# Patient Record
Sex: Female | Born: 1985 | Race: White | Hispanic: No | Smoking: Current every day smoker
Health system: Southern US, Community
[De-identification: ages and names within clinical notes are randomized; demographics above are authoritative.]

## PROBLEM LIST (undated history)

## (undated) DIAGNOSIS — R011 Cardiac murmur, unspecified: Secondary | ICD-10-CM

## (undated) HISTORY — DX: Cardiac murmur, unspecified: R01.1

## (undated) HISTORY — PX: TYMPANOSTOMY TUBE PLACEMENT: SHX32

## (undated) HISTORY — PX: EAR TUBE REMOVAL: SHX1486

---

## 2015-07-14 ENCOUNTER — Ambulatory Visit: Payer: Self-pay

## 2015-07-14 ENCOUNTER — Ambulatory Visit (INDEPENDENT_AMBULATORY_CARE_PROVIDER_SITE_OTHER): Payer: Worker's Compensation | Admitting: Internal Medicine

## 2015-07-14 VITALS — BP 122/72 | HR 85 | Temp 98.6°F | Resp 17 | Ht 67.0 in | Wt 252.0 lb

## 2015-07-14 DIAGNOSIS — M25571 Pain in right ankle and joints of right foot: Secondary | ICD-10-CM

## 2015-07-14 DIAGNOSIS — M659 Synovitis and tenosynovitis, unspecified: Secondary | ICD-10-CM | POA: Diagnosis not present

## 2015-07-14 MED ORDER — MELOXICAM 15 MG PO TABS
15.0000 mg | ORAL_TABLET | Freq: Every day | ORAL | Status: DC
Start: 2015-07-14 — End: 2015-07-28

## 2015-07-14 NOTE — Progress Notes (Signed)
   Subjective:  This chart was scribed for Erin Siaobert Kylee Nardozzi, MD by Erin Pope, ED Scribe. This was seen in room 12 and the patient's care was started at 1:14 PM.   Patient ID: Erin Pope, female    DOB: [birth year], 29 y.o.   MRN: [id number]  HPI   Chief Complaint  Patient presents with  . Foot Pain    right foot pain    HPI Comments:patient  Erin Pope is a 29 y.o. female who presents to the [facility] complaining of an injury that occurred at work. In June she had an accident at work; slipping on a wet spot and hitting her right foot. She's had pain and swelling to right foot. The nurse hotline reccommended rice but has continued  this for over 3 months and problem has not resolved. She's had pain and swelling throughout the day. She is on her feet.   History reviewed. No pertinent past medical history. Prior to Admission medications   Not on File   Review of Systems  Musculoskeletal: Positive for myalgias, arthralgias and gait problem.   Objective:   Physical Exam  Constitutional: She is oriented to person, place, and time. She appears well-developed and well-nourished. No distress.  HENT:  Head: Normocephalic and atraumatic.  Eyes: Conjunctivae and EOM are normal.  Neck: Neck supple.  Cardiovascular: Normal rate.   Pulmonary/Chest: Effort normal.  Musculoskeletal: Normal range of motion.  Mildly swollen over MT 4 and 5 with pain with palpation and weight bearing without ecchymosis   Neurological: She is alert and oriented to person, place, and time.  Skin: Skin is warm and dry.  Psychiatric: She has a normal mood and affect. Her behavior is normal.  Nursing note and vitals reviewed.  Filed Vitals:   07/14/15 1153  BP: 122/72  Pulse: 85  Temp: 98.6 F (37 C)  TempSrc: Oral  Resp: 17  Height: 5\' 7"  (1.702 m)  Weight: 252 lb (114.306 kg)  SpO2: 99%   [facility] reading (PRIMARY) by Dr. Merla Pope.Right foot: no evidence of bony injury in MT  Assessment  & Plan:    1. Pain in joint, ankle and foot, right   2. Tenosynovitis of right foot    secondary to contusion and strain 3 months ago  Restriction of motion with Cam Walker and recheck in 10 days Meloxicam to reduce inflammation Continue ice daily F/u 11/11   Meds ordered this encounter  Medications  . meloxicam (MOBIC) 15 MG tablet    Sig: Take 1 tablet (15 mg total) by mouth daily.    Dispense:  30 tablet    Refill:  0    I have completed the patient encounter in its entirety as documented by the scribe, with editing by me where necessary. Hayly Litsey P. Erin Pope, M.D.

## 2015-07-25 ENCOUNTER — Other Ambulatory Visit: Payer: Self-pay | Admitting: Internal Medicine

## 2015-07-28 ENCOUNTER — Ambulatory Visit (INDEPENDENT_AMBULATORY_CARE_PROVIDER_SITE_OTHER): Payer: Worker's Compensation | Admitting: Internal Medicine

## 2015-07-28 VITALS — BP 122/72 | HR 94 | Temp 99.2°F | Resp 17 | Ht 67.0 in | Wt 261.0 lb

## 2015-07-28 DIAGNOSIS — M25571 Pain in right ankle and joints of right foot: Secondary | ICD-10-CM | POA: Diagnosis not present

## 2015-07-28 MED ORDER — METHOCARBAMOL 750 MG PO TABS
750.0000 mg | ORAL_TABLET | Freq: Three times a day (TID) | ORAL | Status: DC | PRN
Start: 2015-07-28 — End: 2016-07-30

## 2015-07-28 MED ORDER — INDOMETHACIN 50 MG PO CAPS: 50.0000 mg | ORAL_CAPSULE | Freq: Three times a day (TID) | ORAL | Status: DC | PRN

## 2015-07-28 NOTE — Progress Notes (Signed)
   Subjective:  This chart was scribed for Erin Siaobert Marietta Sikkema, MD by Erin Pope, ED Scribe. This patient was seen in room 14 and the patient's care was started at 3:52 PM.   Patient ID: Erin Pope, female    DOB: 03-13-1986, 29 y.o.   MRN: [id number]  HPI Chief Complaint  Patient presents with  . Follow-up    foot injury right side    HPI Comments: Erin Pope is a 29 y.o. female who presents to the [facility] for her 2nd workers compensation OV, a follow up from 10/31. Pt was seen here 2 weeks ago after she had a fall at work 3 months prior, injuring her right foot. She was put in a cam walker and prescribed meloxicam. Since last visit, she reports being out of work completely for the first week. She went back to work this week on light duty and sitting, working on the computer. She still has right foot pain with walking and bearing weight. She has had improved swelling but still has some swelling at night. Pt has been taking meloxicam without much help  Review of Systems Erin Pope    Objective:   Physical Exam  Constitutional: She is oriented to person, place, and time. She appears well-developed and well-nourished. No distress.  HENT:  Head: Normocephalic and atraumatic.  Eyes: Conjunctivae and EOM are normal.  Neck: Neck supple.  Cardiovascular: Normal rate.   Pulmonary/Chest: Effort normal.  Musculoskeletal: Normal range of motion.  Foot is less swollen but is still tender over 4th and 5th MT especially proximally on dorsal aspect. She is now tender over the syndesmosis. Dorsiflexion is good without pain but weight bearing is painful thru the forefoot Squeeze nontender and MTPs stable   Neurological: She is alert and oriented to person, place, and time.  Skin: Skin is warm and dry.  Psychiatric: She has a normal mood and affect. Her behavior is normal.  Nursing note and vitals reviewed.  Filed Vitals:   07/28/15 1457  BP: 122/72  Pulse: 94  Temp: 99.2 F (37.3 C)   TempSrc: Oral  Resp: 17  Height: 5\' 7"  (1.702 m)  Weight: 261 lb (118.389 kg)  SpO2: 97%   Assessment & Plan:   1. Pain in joint, ankle and foot, right   ?etiology--if just tenosynovitis I think she should have had a better response to the Cam, so we will refer to Dr Victorino DikeHewitt for eval  Continue cam walker Light duty work    Orders Placed This Encounter  Procedures  . Ambulatory referral to Orthopedic Surgery    Referral Priority:  Routine    Referral Type:  Surgical    Referral Reason:  Specialty Services Required    Referred to Provider:  Toni ArthursJohn Hewitt, MD    Requested Specialty:  Orthopedic Surgery    Number of Visits Requested:  1      I have completed the patient encounter in its entirety as documented by the scribe, with editing by me where necessary. Erin Pope P. Merla Richesoolittle, M.D.

## 2015-08-01 ENCOUNTER — Other Ambulatory Visit: Payer: Self-pay | Admitting: Internal Medicine

## 2015-09-19 ENCOUNTER — Ambulatory Visit (INDEPENDENT_AMBULATORY_CARE_PROVIDER_SITE_OTHER): Payer: Worker's Compensation | Admitting: Internal Medicine

## 2015-09-19 ENCOUNTER — Ambulatory Visit: Payer: Worker's Compensation

## 2015-09-19 VITALS — BP 110/72 | HR 90 | Temp 98.3°F | Resp 18 | Ht 67.0 in | Wt 265.0 lb

## 2015-09-19 DIAGNOSIS — M25571 Pain in right ankle and joints of right foot: Secondary | ICD-10-CM

## 2015-09-19 NOTE — Progress Notes (Signed)
Subjective:  By signing my name below, I, Rawaa Al Rifaie, attest that this documentation has been prepared under the direction and in the presence of Ellamae Sia, MD.  Watt Climes Rifaie, Medical Scribe. 09/19/2015.  9:04 AM. I have completed the patient encounter in its entirety as documented by the scribe, with editing by me where necessary. Tyller Bowlby P. Merla Riches, M.D.    Patient ID: Erin Pope, female    DOB: Dec 09, 1985, 30 y.o.   MRN: [id number]  Chief Complaint  Patient presents with  . Follow-up    rt foot     HPI HPI Comments: Erin Pope is a 30 y.o. female who presents to [facility] for a W/C follow up regarding a right foot injury. a follow up from 11/14. Pt was seen here 2 weeks prior after she had a fall at work 3 months prior pm 10/31, injuring her right foot. She was put in a cam walker and prescribed meloxicam. Today, pt indicates that she has to be standing on her feet for a long time at work, and reports that it is not very convenient for her job. She states that her boss had stated that on 01/28 she will not be able to work if she is still on her boots due to a change in the computer system. Pt reports that her foot pain has not improved much. She notes that the pain is present in the mid aspect on the top of the foot, and indicates that the pain is most severe at the morning time after waking up. Pt states that she attempted to follow up with orthopedics yesterday, however there were some logistic problems that showed that she was a self-referral, therefore she rescheduled.   Prior to Admission medications   Medication Sig Start Date End Date Taking? Authorizing Provider  indomethacin (INDOCIN) 50 MG capsule Take 1 capsule (50 mg total) by mouth 3 (three) times daily as needed. Take with food Patient not taking: Reported on 09/19/2015 07/28/15   Tonye Pearson, MD  methocarbamol (ROBAXIN-750) 750 MG tablet Take 1 tablet (750 mg total) by mouth every  8 (eight) hours as needed for muscle spasms. Patient not taking: Reported on 09/19/2015 07/28/15   Tonye Pearson, MD    Allergies  Allergen Reactions  . Bactrim [Sulfamethoxazole-Trimethoprim] Hives    Review of Systems  Musculoskeletal: Positive for arthralgias. Negative for joint swelling.      Objective:   Physical Exam  Constitutional: She is oriented to person, place, and time. She appears well-developed and well-nourished. No distress.  HENT:  Head: Normocephalic and atraumatic.  Eyes: EOM are normal. Pupils are equal, round, and reactive to light.  Neck: Neck supple.  Cardiovascular: Normal rate.   Pulmonary/Chest: Effort normal.  Musculoskeletal: She exhibits tenderness.  Very tender over the mid fourth metatarsal without swelling or redness. Pain worsened by inversion.  Ankle stable.  5th metatarsal non tender.  Achilles intact.   Neurological: She is alert and oriented to person, place, and time. No cranial nerve deficit.  Skin: Skin is warm and dry.  Psychiatric: She has a normal mood and affect. Her behavior is normal.  Nursing note and vitals reviewed.   [facility] (PRIMARY) x-ray report read by Dr. Ellamae Sia, MD: Right foot- no bony abnormality.   BP 110/72 mmHg  Pulse 90  Temp(Src) 98.3 F (36.8 C) (Oral)  Resp 18  Ht 5\' 7"  (1.702 m)  Wt 265 lb (120.203 kg)  BMI 41.50  kg/m2  SpO2 98%  LMP 09/08/2015     Assessment & Plan:  Pain in joint, ankle and foot, right - Plan: DG Foot Complete Right   Disc with our WC desk who will rearrange referral--OOW til then unless they can assure nonweightbearing

## 2015-10-14 ENCOUNTER — Encounter: Payer: Self-pay | Admitting: Podiatry

## 2015-10-14 ENCOUNTER — Ambulatory Visit (INDEPENDENT_AMBULATORY_CARE_PROVIDER_SITE_OTHER): Payer: Worker's Compensation | Admitting: Podiatry

## 2015-10-14 VITALS — BP 110/78 | HR 93 | Resp 16

## 2015-10-14 DIAGNOSIS — G5761 Lesion of plantar nerve, right lower limb: Secondary | ICD-10-CM

## 2015-10-14 MED ORDER — METHYLPREDNISOLONE 4 MG PO TBPK: ORAL_TABLET | ORAL | Status: DC

## 2015-10-14 NOTE — Progress Notes (Signed)
   Subjective:    Patient ID: Erin Pope, female    DOB: 11-Jan-1986, 30 y.o.   MRN: [id number]  HPI : she presents to see me today for second opinion regarding her right foot. She's been seen by previous doctors replaced her in a Cam Walker for tendinitis or bone pain to the right foot. June 2016 I'll working at [employer] she slipped on mats that held water beneath it. She fell rotating the foo inversion. She states that she has radiating pains and dull aching pains and it really doesn't bother her if she is off of it.    Review of Systems  All other systems reviewed and are negative.      Objective:   Physical Exam : vital signs are stable she is alert and oriented 30 year old female palpable pulses neurologic sensorium is intact with a palpable Mulder's click third interdigital space right foot. Radiographs do not demonstrate any type of osseus abnormalities at this point if there were any osseous problems from the fall we would be able to see them from fracture callus. Deep tendon reflexes are intact and muscle strength is 5 over 5 dorsiflexors plantar flexors and inverters everters all intrinsic musculature is intact. Cutaneous evaluation of a straight supple well-hydrated cutis no erythema edema cellulitis drainage or odor. Orthopedic evaluation of his joints all joints distal to the ankle for range of motion without crepitation.        Assessment & Plan:   assessment: it appears to be a neuroma third interdigital space of the right foot.   plan: I injected the area today with Kenalog and local anesthetic placed her on a Medrol Dosepak to be followed by meloxicam and I will follow-up with her in 2 weeks and consider an MRI if she is not improved.

## 2015-10-27 DIAGNOSIS — Z0271 Encounter for disability determination: Secondary | ICD-10-CM

## 2015-10-28 ENCOUNTER — Encounter: Payer: Self-pay | Admitting: Podiatry

## 2015-10-28 ENCOUNTER — Ambulatory Visit (INDEPENDENT_AMBULATORY_CARE_PROVIDER_SITE_OTHER): Payer: Worker's Compensation | Admitting: Podiatry

## 2015-10-28 VITALS — BP 110/72 | HR 84 | Resp 16

## 2015-10-28 DIAGNOSIS — G5761 Lesion of plantar nerve, right lower limb: Secondary | ICD-10-CM

## 2015-10-28 NOTE — Progress Notes (Signed)
She presents today for follow-up of a neuroma third interdigital space of her right foot.she stated is still tender after the steroid injection.  Objective: Vital signs are stable she is alert and oriented 3. Pulses are palpable. Palpable Mulder's click and third interdigital space of the right foot is consistent with neuroma.  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  Plan: Reinjected today started with dehydrated alcohol and will follow up with her in 3 weeks for her second dose.

## 2015-11-18 ENCOUNTER — Encounter: Payer: Self-pay | Admitting: Podiatry

## 2015-11-18 ENCOUNTER — Ambulatory Visit (INDEPENDENT_AMBULATORY_CARE_PROVIDER_SITE_OTHER): Payer: Worker's Compensation | Admitting: Podiatry

## 2015-11-18 VITALS — BP 102/56 | HR 86 | Resp 16

## 2015-11-18 DIAGNOSIS — G5761 Lesion of plantar nerve, right lower limb: Secondary | ICD-10-CM | POA: Diagnosis not present

## 2015-11-19 NOTE — Progress Notes (Signed)
She presents today after her first dose of dehydrated alcohol and states it is not any better yet. She states that since I put her back to work on regular shift her foot is throbbing by the end of the day and would like to have some type of medicine to help control that. I explained to her that we would not provide her with a narcotic for this that a nonsteroidal would probably suffice and she states that she has that at home.  Objective: Vital signs stable alert and oriented 3. Palpable Mulder's click to the third interdigital space of the right foot.  Assessment: Neuroma third interdigital space right foot.  Plan: I injected her second dose of dehydrated alcohol today third interdigital space of the right foot. Follow up with her in 3 weeks for her third dose.

## 2015-12-09 ENCOUNTER — Encounter: Payer: Self-pay | Admitting: Podiatry

## 2015-12-09 ENCOUNTER — Ambulatory Visit (INDEPENDENT_AMBULATORY_CARE_PROVIDER_SITE_OTHER): Payer: Worker's Compensation | Admitting: Podiatry

## 2015-12-09 DIAGNOSIS — G5761 Lesion of plantar nerve, right lower limb: Secondary | ICD-10-CM | POA: Diagnosis not present

## 2015-12-09 NOTE — Progress Notes (Signed)
She presents today for follow-up of her first dehydrated alcohol injection third interdigital space right foot. She states this doing so-so.  Objective: Vital signs are stable she is alert and oriented 3. She still has pain on palpation third interdigital space of the right foot with a palpable Mulder's click. Pulses remain palpable.  Assessment: Neuroma third interdigital space right foot slowly improving.  Plan: We injected her second dose of dehydrated alcohol today and I will follow-up with her in 3 weeks.

## 2016-01-01 ENCOUNTER — Ambulatory Visit (INDEPENDENT_AMBULATORY_CARE_PROVIDER_SITE_OTHER): Payer: Worker's Compensation | Admitting: Podiatry

## 2016-01-01 ENCOUNTER — Encounter: Payer: Self-pay | Admitting: Podiatry

## 2016-01-01 DIAGNOSIS — G5761 Lesion of plantar nerve, right lower limb: Secondary | ICD-10-CM

## 2016-01-03 NOTE — Progress Notes (Signed)
She presents today for another injection third interdigital space of the right foot. She states that after the last injection he was feeling pretty good now may have overdone it by exercising. So starting to hurt again.  Objective: Vital signs are stable she is alert and oriented 3. Pulses are palpable. Palpable Mulder's click third interdigital space of the right foot. No calf pain no open lesions or wounds.  Assessment: Neuroma third interdigital space right foot. Slowly resolving with dehydrated alcohol injections.  Plan: Discussed appropriate shoe gear stretching exercises and ice therapy. Reinjected another dose of dehydrated alcohol third interdigital space of the right foot. Follow up with her in 3 weeks.

## 2016-01-22 ENCOUNTER — Ambulatory Visit: Payer: Self-pay | Admitting: Podiatry

## 2016-02-05 ENCOUNTER — Ambulatory Visit: Payer: Self-pay | Admitting: Podiatry

## 2016-07-30 ENCOUNTER — Ambulatory Visit (INDEPENDENT_AMBULATORY_CARE_PROVIDER_SITE_OTHER): Payer: Self-pay | Admitting: Physician Assistant

## 2016-07-30 VITALS — BP 116/72 | HR 82 | Temp 98.2°F | Resp 16 | Ht 67.0 in | Wt 283.0 lb

## 2016-07-30 DIAGNOSIS — K047 Periapical abscess without sinus: Secondary | ICD-10-CM

## 2016-07-30 DIAGNOSIS — K0889 Other specified disorders of teeth and supporting structures: Secondary | ICD-10-CM

## 2016-07-30 DIAGNOSIS — R05 Cough: Secondary | ICD-10-CM

## 2016-07-30 DIAGNOSIS — R0981 Nasal congestion: Secondary | ICD-10-CM

## 2016-07-30 DIAGNOSIS — R062 Wheezing: Secondary | ICD-10-CM

## 2016-07-30 DIAGNOSIS — R059 Cough, unspecified: Secondary | ICD-10-CM

## 2016-07-30 MED ORDER — ALBUTEROL SULFATE HFA 108 (90 BASE) MCG/ACT IN AERS: 2.0000 | INHALATION_SPRAY | RESPIRATORY_TRACT | 0 refills | Status: DC | PRN

## 2016-07-30 MED ORDER — AMOXICILLIN-POT CLAVULANATE 875-125 MG PO TABS: 1.0000 | ORAL_TABLET | Freq: Two times a day (BID) | ORAL | 0 refills | Status: DC

## 2016-07-30 MED ORDER — BENZONATATE 100 MG PO CAPS: 100.0000 mg | ORAL_CAPSULE | Freq: Three times a day (TID) | ORAL | 0 refills | Status: DC | PRN

## 2016-07-30 MED ORDER — NAPROXEN 500 MG PO TABS: 500.0000 mg | ORAL_TABLET | Freq: Two times a day (BID) | ORAL | 0 refills | Status: DC

## 2016-07-30 MED ORDER — FLUTICASONE PROPIONATE 50 MCG/ACT NA SUSP: 2.0000 | Freq: Every day | NASAL | 0 refills | Status: DC

## 2016-07-30 MED ORDER — HYDROCOD POLST-CPM POLST ER 10-8 MG/5ML PO SUER: 5.0000 mL | Freq: Two times a day (BID) | ORAL | 0 refills | Status: DC | PRN

## 2016-07-30 MED ORDER — KETOROLAC TROMETHAMINE 60 MG/2ML IM SOLN
60.0000 mg | Freq: Once | INTRAMUSCULAR | Status: AC
  Administered 2016-07-30: 60 mg via INTRAMUSCULAR

## 2016-07-30 NOTE — Progress Notes (Signed)
Erin Pope  MRN: [id number] DOB: 1985/10/28  Subjective:  Erin Pope is a 30 y.o. female seen in office today for a chief complaint of upper right tooth pain x 4 days. Pt notes she had a head cold 3 weeks ago which has never quite went away. She [place] and once she got back her sickness was back in full swing with nasal congestion and nonproductive cough. She also had new onset tooth pain. She rates the tooth pain as a 9/10. Has associated swelling. Denies purulent drainage and sensitivity. She has tried aleve, oragel, and salt water gargles with minimal relief. Notes she has a broken tooth in her mouth and the pain is above it. She has not been able to go to the dentist because she does not have insurance and states that it would be too expensive.   Review of Systems  Constitutional: Positive for fever (intermittent). Negative for chills, diaphoresis and fatigue.  HENT: Positive for congestion and rhinorrhea. Negative for sneezing and sore throat.   Respiratory: Negative for chest tightness and shortness of breath.   Gastrointestinal: Negative for diarrhea, nausea and vomiting.  Musculoskeletal: Negative for myalgias.  Neurological: Positive for headaches. Negative for dizziness.    There are no active problems to display for this patient.   No current outpatient prescriptions on file prior to visit.   No current facility-administered medications on file prior to visit.     Allergies  Allergen Reactions  . Bactrim [Sulfamethoxazole-Trimethoprim] Hives     Objective:  BP 116/72   Pulse 82   Temp 98.2 F (36.8 C) (Oral)   Resp 16   Ht 5\' 7"  (1.702 m)   Wt 283 lb (128.4 kg)   SpO2 96%   BMI 44.32 kg/m   Physical Exam  Constitutional: She is oriented to person, place, and time.  Well developed, well nourished, patient is in moderate pain sitting on exam table, intermittently coughing during exam.  HENT:  Head: Normocephalic and  atraumatic.  Right Ear: A middle ear effusion is present.  Left Ear: A middle ear effusion is present.  Nose: Mucosal edema present. Right sinus exhibits maxillary sinus tenderness. Right sinus exhibits no frontal sinus tenderness. Left sinus exhibits no maxillary sinus tenderness and no frontal sinus tenderness.  Mouth/Throat: Uvula is midline and mucous membranes are normal. Posterior oropharyngeal erythema present.    Right upper gum with ~ 1.5 cm inflammation and erythema just superior to tooth socket with partial tooth remnants with signs of tooth decay.  Exquisite pain to palpation of upper right gum. No purulent discharge expressed. Dental caries and tooth decay noted thorughout mouth.   Cardiovascular: Normal rate and regular rhythm.   Pulmonary/Chest: Effort normal. She has wheezes (intermittent in posterior RUF, cleared with cough).  Lymphadenopathy:       Head (right side): No submental, no submandibular, no tonsillar, no preauricular, no posterior auricular and no occipital adenopathy present.       Head (left side): No submental, no submandibular, no tonsillar, no preauricular, no posterior auricular and no occipital adenopathy present.    She has no cervical adenopathy.       Right: No supraclavicular adenopathy present.       Left: No supraclavicular adenopathy present.  Neurological: She is alert and oriented to person, place, and time. Gait normal.  Skin: Skin is warm and dry.  Psychiatric: Affect normal.    Assessment and Plan :   1. Tooth  pain - ketorolac (TORADOL) injection 60 mg; Inject 2 mLs (60 mg total) into the muscle once.  2. Dental abscess -Pt instructed that she needs evaluation from a dentist immediately. She was given information on dentist offices that are open tomorrow. The importance of having this examined by a dentist was highly stressed to patient and she agrees to seek care from a dentist despite having no dental insurance.  - amoxicillin-clavulanate  (AUGMENTIN) 875-125 MG tablet; Take 1 tablet by mouth 2 (two) times daily.  Dispense: 20 tablet; Refill: 0 - naproxen (NAPROSYN) 500 MG tablet; Take 1 tablet (500 mg total) by mouth 2 (two) times daily with a meal.  Dispense: 30 tablet; Refill: 0  3. Wheezing - albuterol (PROVENTIL HFA;VENTOLIN HFA) 108 (90 Base) MCG/ACT inhaler; Inhale 2 puffs into the lungs every 4 (four) hours as needed for wheezing or shortness of breath (cough, shortness of breath or wheezing.).  Dispense: 1 Inhaler; Refill: 0  4. Cough - chlorpheniramine-HYDROcodone (TUSSIONEX PENNKINETIC ER) 10-8 MG/5ML SUER; Take 5 mLs by mouth every 12 (twelve) hours as needed for cough.  Dispense: 100 mL; Refill: 0 - benzonatate (TESSALON) 100 MG capsule; Take 1-2 capsules (100-200 mg total) by mouth 3 (three) times daily as needed for cough.  Dispense: 40 capsule; Refill: 0  5. Nasal congestion - fluticasone (FLONASE) 50 MCG/ACT nasal spray; Place 2 sprays into both nostrils daily.  Dispense: 16 g; Refill: 0  -Return to clinic if symptoms worsen, do not improve, or as needed   Benjiman CoreBrittany Camron Essman PA-C  [facility] 07/30/2016 6:55 PM

## 2016-07-30 NOTE — Patient Instructions (Addendum)
Go and immediately get the antibiotic. Start it today. You should contact a dentist as soon as possible so they can extract the tooth. [facility] is opened on Saturday.  For cough and cold symptoms use the cough syrup at night and the tessalon perles during the day.  You can use the albuterol inhaler every 4-6 hours as needed for wheezing.  You can use the flonase for congestion.   Please seek care immediately if your symptoms worsen or you develop fever, chills, or excess pain.    IF you received an x-ray today, you will receive an invoice from [facility]. Please contact [facility] at [phone] with questions or concerns regarding your invoice.   IF you received labwork today, you will receive an invoice from [facility]. Please contact [facility] at [phone] with questions or concerns regarding your invoice.   Our billing staff will not be able to assist you with questions regarding bills from these companies.  You will be contacted with the lab results as soon as they are available. The fastest way to get your results is to activate your My Chart account. Instructions are located on the last page of this paperwork. If you have not heard from us regarding the results in 2 weeks, please contact this office.

## 2017-03-08 IMAGING — CR DG FOOT COMPLETE 3+V*R*
3 series · 3 of 3 positions shown · non-contrast
Comparison: 07/14/2015.

CLINICAL DATA: Pain in the midportion of the fourth metatarsal.

EXAM:
RIGHT FOOT COMPLETE - 3+ VIEW

[AP]
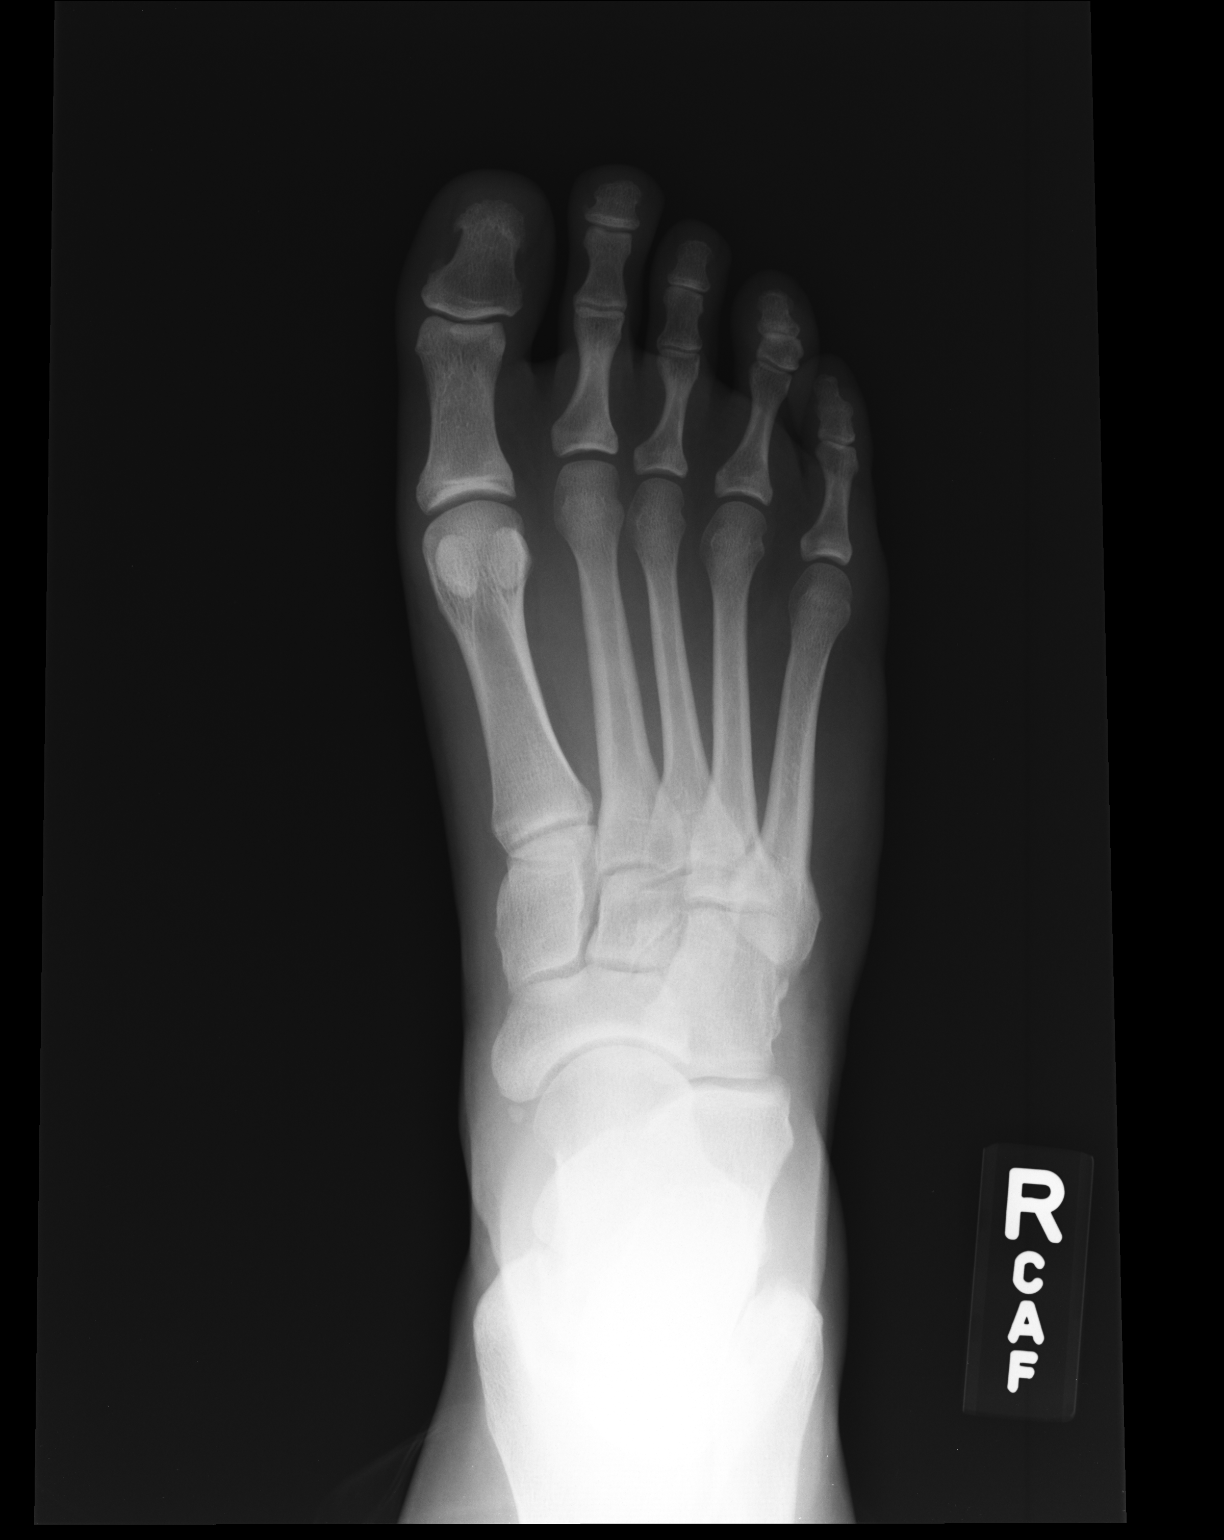

[ap obl int rot]
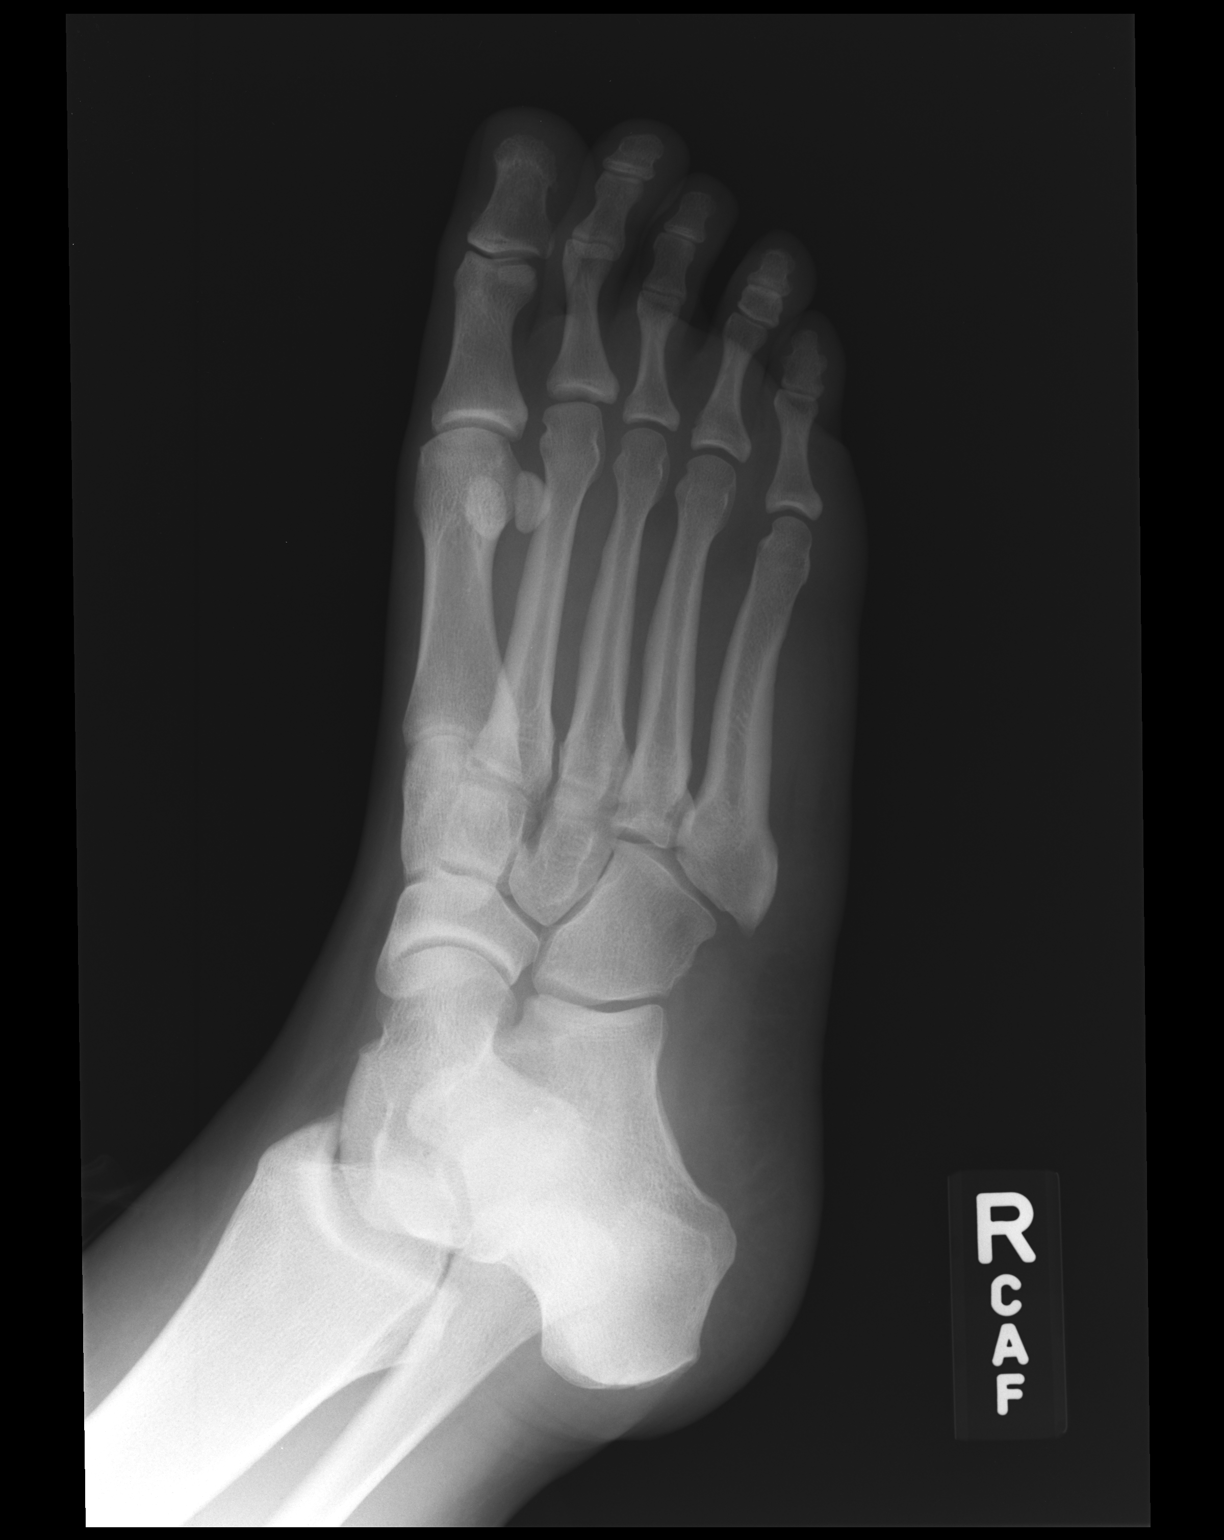

[lateral]
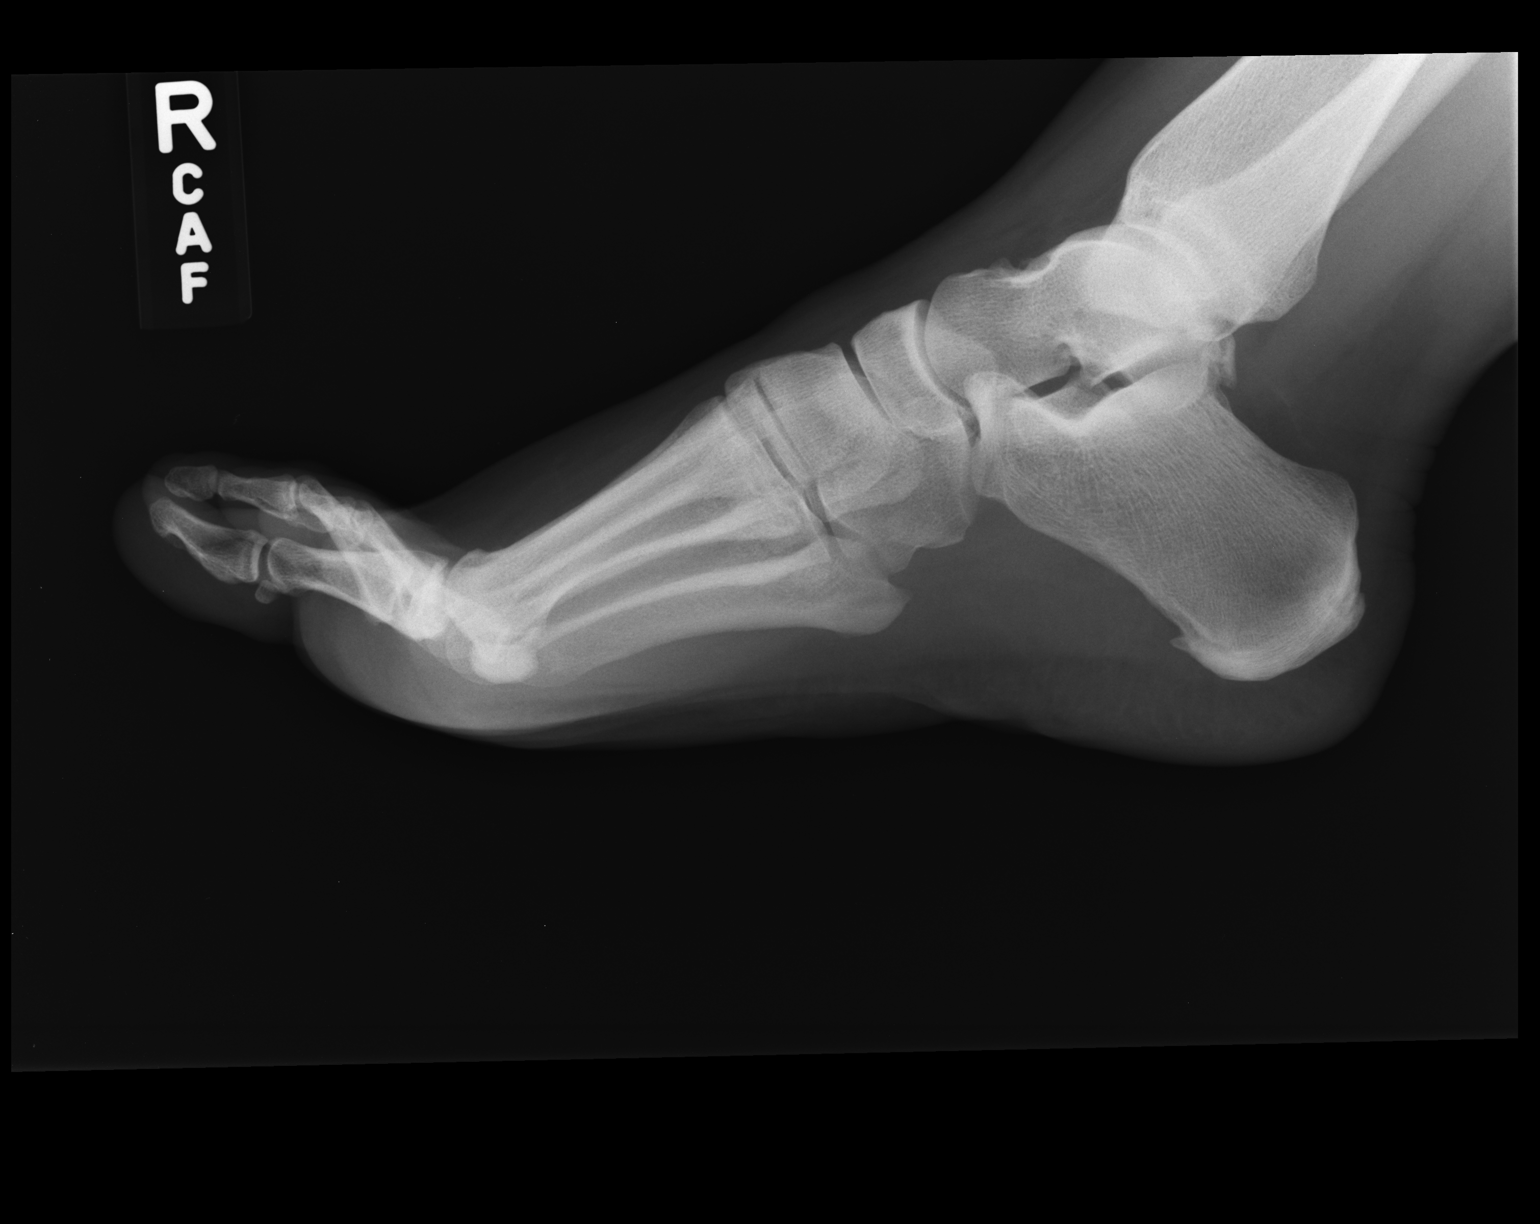

[3 of 3 positions shown; findings below may reference images not displayed]

FINDINGS: There is no evidence of fracture or dislocation. There is no
evidence of arthropathy or other focal bone abnormality. Soft
tissues are unremarkable.
IMPRESSION: Normal

## 2017-06-02 ENCOUNTER — Encounter: Payer: Self-pay | Admitting: Family Medicine

## 2017-06-02 ENCOUNTER — Ambulatory Visit (INDEPENDENT_AMBULATORY_CARE_PROVIDER_SITE_OTHER): Payer: Self-pay | Admitting: Family Medicine

## 2017-06-02 VITALS — BP 108/74 | HR 86 | Temp 97.7°F | Resp 16 | Ht 67.0 in | Wt 252.0 lb

## 2017-06-02 DIAGNOSIS — J069 Acute upper respiratory infection, unspecified: Secondary | ICD-10-CM

## 2017-06-02 MED ORDER — GUAIFENESIN-CODEINE 100-10 MG/5ML PO SYRP: 5.0000 mL | ORAL_SOLUTION | Freq: Three times a day (TID) | ORAL | 0 refills | Status: DC | PRN

## 2017-06-02 NOTE — Progress Notes (Signed)
9/20/201811:29 AM  Erin Pope [birth year], 31 y.o. female [id number]  Chief Complaint  Patient presents with  . Cough    x 1 week, no fevers`  . URI    x 1 week    HPI:   Patient is a 31 y.o. female who presents today for 1 week of cough, non productive, nasal congestion and pressure and diarrhea. Denies any sore throat, ear pain, sinus pain, sob, wheezing, nausea or vomiting. She does smoke, denies any h/o lung disease. + sick contacts at work. Has tried mucinex- DM without any improvement in symptoms or sleep.   Depression screen [facility] 2/9 06/02/2017 07/28/2015 07/14/2015  Decreased Interest 0 0 0  Down, Depressed, Hopeless 0 0 0  PHQ - 2 Score 0 0 0    Allergies  Allergen Reactions  . Bactrim [Sulfamethoxazole-Trimethoprim] Hives    Current Outpatient Prescriptions on File Prior to Visit  Medication Sig Dispense Refill  . albuterol (PROVENTIL HFA;VENTOLIN HFA) 108 (90 Base) MCG/ACT inhaler Inhale 2 puffs into the lungs every 4 (four) hours as needed for wheezing or shortness of breath (cough, shortness of breath or wheezing.). 1 Inhaler 0  . amoxicillin-clavulanate (AUGMENTIN) 875-125 MG tablet Take 1 tablet by mouth 2 (two) times daily. (Patient not taking: Reported on 06/02/2017) 20 tablet 0  . benzonatate (TESSALON) 100 MG capsule Take 1-2 capsules (100-200 mg total) by mouth 3 (three) times daily as needed for cough. (Patient not taking: Reported on 06/02/2017) 40 capsule 0  . chlorpheniramine-HYDROcodone (TUSSIONEX PENNKINETIC ER) 10-8 MG/5ML SUER Take 5 mLs by mouth every 12 (twelve) hours as needed for cough. (Patient not taking: Reported on 06/02/2017) 100 mL 0  . fluticasone (FLONASE) 50 MCG/ACT nasal spray Place 2 sprays into both nostrils daily. (Patient not taking: Reported on 06/02/2017) 16 g 0  . naproxen (NAPROSYN) 500 MG tablet Take 1 tablet (500 mg total) by mouth 2 (two) times daily with a meal. (Patient not taking: Reported on 06/02/2017) 30 tablet 0   No current  facility-administered medications on file prior to visit.     No past medical history on file.  Past Surgical History:  Procedure Laterality Date  . EAR TUBE REMOVAL      Social History  Substance Use Topics  . Smoking status: Current Every Day Smoker    Packs/day: 0.50    Years: 8.00    Types: Cigarettes  . Smokeless tobacco: Not on file  . Alcohol use No    No family history on file.  Review of Systems  Constitutional: Negative for chills and fever.  HENT: Positive for congestion. Negative for ear pain, sinus pain and sore throat.   Respiratory: Positive for cough. Negative for sputum production, shortness of breath and wheezing.   Cardiovascular: Negative for chest pain, palpitations and leg swelling.  Gastrointestinal: Positive for diarrhea. Negative for abdominal pain, nausea and vomiting.     OBJECTIVE:  Blood pressure 108/74, pulse 86, temperature 97.7 F (36.5 C), temperature source Oral, resp. rate 16, height  (1.702 m), weight 252 lb (114.3 kg), last menstrual period 05/30/2017, SpO2 96 %.  Physical Exam  Constitutional: She is oriented to person, place, and time and well-developed, well-nourished, and in no distress.  HENT:  Head: Normocephalic and atraumatic.  Right Ear: Hearing, tympanic membrane, external ear and ear canal normal.  Left Ear: Hearing, tympanic membrane, external ear and ear canal normal.  Mouth/Throat: Oropharynx is clear and moist.  Eyes: Pupils are equal, round, and reactive to  light. EOM are normal.  Neck: Neck supple.  Cardiovascular: Normal rate, regular rhythm and normal heart sounds.  Exam reveals no gallop and no friction rub.   No murmur heard. Pulmonary/Chest: Effort normal and breath sounds normal. She has no wheezes. She has no rales.  Abdominal: Soft. Bowel sounds are normal. She exhibits no distension. There is no tenderness.  Lymphadenopathy:    She has no cervical adenopathy.  Neurological: She is alert and oriented  to person, place, and time. Gait normal.  Skin: Skin is warm and dry.    No results found for this or any previous visit.   ASSESSMENT and PLAN:  1. URI with cough and congestion Discussed supportive measures for URI: increase hydration, rest, OTC medications, etc. RTC precautions discussed.     Myles Lipps, MD Primary Care at [facility] [address] [place], [place] [address] Ph.  [phone] Fax [phone]

## 2017-06-02 NOTE — Patient Instructions (Signed)
     IF you received an x-ray today, you will receive an invoice from [facility]. Please contact [facility] at [phone] with questions or concerns regarding your invoice.   IF you received labwork today, you will receive an invoice from [facility]. Please contact [facility] at [phone] with questions or concerns regarding your invoice.   Our billing staff will not be able to assist you with questions regarding bills from these companies.  You will be contacted with the lab results as soon as they are available. The fastest way to get your results is to activate your My Chart account. Instructions are located on the last page of this paperwork. If you have not heard from us regarding the results in 2 weeks, please contact this office.     

## 2018-06-01 ENCOUNTER — Ambulatory Visit (INDEPENDENT_AMBULATORY_CARE_PROVIDER_SITE_OTHER): Payer: Self-pay | Admitting: Physician Assistant

## 2018-06-01 ENCOUNTER — Other Ambulatory Visit: Payer: Self-pay

## 2018-06-01 ENCOUNTER — Encounter: Payer: Self-pay | Admitting: Physician Assistant

## 2018-06-01 VITALS — BP 108/64 | HR 88 | Temp 99.2°F | Resp 18 | Ht 67.0 in | Wt 216.2 lb

## 2018-06-01 DIAGNOSIS — T1501XA Foreign body in cornea, right eye, initial encounter: Secondary | ICD-10-CM

## 2018-06-01 MED ORDER — ERYTHROMYCIN 5 MG/GM OP OINT: 1.0000 "application " | TOPICAL_OINTMENT | Freq: Four times a day (QID) | OPHTHALMIC | 0 refills | Status: AC

## 2018-06-01 NOTE — Patient Instructions (Addendum)
  Please pick up antibiotic eyedrops and start today.  I placed a referral to ophthalmology and they should contact you within the next couple days to schedule appointment.  If any of your symptoms worsen or you develop new concerning symptoms, please seek care immediately.   If you have lab work done today you will be contacted with your lab results within the next 2 weeks.  If you have not heard from us then please contact us. The fastest way to get your results is to register for My Chart.   IF you received an x-ray today, you will receive an invoice from [facility]. Please contact [facility] at [phone] with questions or concerns regarding your invoice.   IF you received labwork today, you will receive an invoice from [facility]. Please contact [facility] at [phone] with questions or concerns regarding your invoice.   Our billing staff will not be able to assist you with questions regarding bills from these companies.  You will be contacted with the lab results as soon as they are available. The fastest way to get your results is to activate your My Chart account. Instructions are located on the last page of this paperwork. If you have not heard from us regarding the results in 2 weeks, please contact this office.

## 2018-06-01 NOTE — Progress Notes (Signed)
Erin Pope  MRN: [id number] DOB: 01/08/1986  Subjective:  Erin Pope is a 32 y.o. female seen in office today for a chief complaint of right eye issues x 4 days. Started out with watery discharge. Woke up the next day with it crusted over completely. Has associated itching and watery discharge.Marland Kitchen  Has noticed foreign body sensation.  Did notice a little white bump on her iris that is not typically there.  She did try to remove it with a cotton swab, with no success. Denies eye pain, photophobia, visual disturbance, fever, chills, nausea, vomiting.  Denies acute injury.Tried napchon eye drops and that seemed to not help much. Has had eye extensions on for 2 weeks, no issues.  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   Review of Systems  Constitutional: Negative for chills, diaphoresis and fever.  Respiratory: Negative for cough.     There are no active problems to display for this patient.   Current Outpatient Medications on File Prior to Visit  Medication Sig Dispense Refill  . albuterol (PROVENTIL HFA;VENTOLIN HFA) 108 (90 Base) MCG/ACT inhaler Inhale 2 puffs into the lungs every 4 (four) hours as needed for wheezing or shortness of breath (cough, shortness of breath or wheezing.). 1 Inhaler 0  . chlorpheniramine-HYDROcodone (TUSSIONEX PENNKINETIC ER) 10-8 MG/5ML SUER Take 5 mLs by mouth every 12 (twelve) hours as needed for cough. (Patient not taking: Reported on 06/02/2017) 100 mL 0  . fluticasone (FLONASE) 50 MCG/ACT nasal spray Place 2 sprays into both nostrils daily. (Patient not taking: Reported on 06/02/2017) 16 g 0  . guaiFENesin-codeine (ROBITUSSIN AC) 100-10 MG/5ML syrup Take 5 mLs by mouth 3 (three) times daily as needed for cough. (Patient not taking: Reported on 06/01/2018) 120 mL 0  . naproxen (NAPROSYN) 500 MG tablet Take 1 tablet (500 mg total) by mouth 2 (two) times daily with a meal. (Patient not taking: Reported on 06/02/2017) 30 tablet 0   No current facility-administered  medications on file prior to visit.     Allergies  Allergen Reactions  . Bactrim [Sulfamethoxazole-Trimethoprim] Hives   Social History   Socioeconomic History  . Marital status: Single    Spouse name: Not on file  . Number of children: Not on file  . Years of education: Not on file  . Highest education level: Not on file  Occupational History  . Not on file  Social Needs  . Financial resource strain: Not on file  . Food insecurity:    Worry: Not on file    Inability: Not on file  . Transportation needs:    Medical: Not on file    Non-medical: Not on file  Tobacco Use  . Smoking status: Current Every Day Smoker    Packs/day: 0.50    Years: 8.00    Pack years: 4.00    Types: Cigarettes  . Smokeless tobacco: Never Used  Substance and Sexual Activity  . Alcohol use: No    Alcohol/week: 0.0 standard drinks  . Drug use: No  . Sexual activity: Never  Lifestyle  . Physical activity:    Days per week: Not on file    Minutes per session: Not on file  . Stress: Not on file  Relationships  . Social connections:    Talks on phone: Not on file    Gets together: Not on file    Attends religious service: Not on file    Active member of club or organization: Not on file  Attends meetings of clubs or organizations: Not on file    Relationship status: Not on file  . Intimate partner violence:    Fear of current or ex partner: Not on file    Emotionally abused: Not on file    Physically abused: Not on file    Forced sexual activity: Not on file  Other Topics Concern  . Not on file  Social History Narrative  . Not on file      Objective:  BP 108/64   Pulse 88   Temp 99.2 F (37.3 C) (Oral)   Resp 18   Ht 5\' 7"  (1.702 m)   Wt 216 lb 3.2 oz (98.1 kg)   LMP 05/18/2018   SpO2 97%   BMI 33.86 kg/m   Physical Exam  Constitutional: She is oriented to person, place, and time. She appears well-developed and well-nourished. No distress.  HENT:  Head: Normocephalic  and atraumatic. Head is without right periorbital erythema and without left periorbital erythema.  Eyes: Pupils are equal, round, and reactive to light. EOM are normal. Right eye exhibits discharge (watery discharge note). Right eye exhibits no hordeolum. Foreign body (~0.5 mm circular, non translucent, white lesion noted at medial aspect of right iris ) present in the right eye. Right conjunctiva is injected. Right conjunctiva has no hemorrhage.  Fundoscopic exam:      The right eye shows no AV nicking, no exudate and no papilledema. The right eye shows red reflex.  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   No pain with palpation of bilateral periorbital regions.   Neck: Normal range of motion.  Pulmonary/Chest: Effort normal.  Neurological: She is alert and oriented to person, place, and time.  Skin: Skin is warm and dry.  Psychiatric: She has a normal mood and affect.  Vitals reviewed.    Visual Acuity Screening   Right eye Left eye Both eyes  Without correction:     With correction: 20/25 20/20 20/25    PROCEDURE NOTE: eye exam Verbal consent obtained. Eyelids everted and swept for foreign body. The eye was anesthetized with 2 drops of proparacaine 0.5% and stained with fluorescein. Examination under woods lamp shows superimposed focal area of increased stain uptake at site of suspected foreign object.  The eye was then irrigated copiously with saline.  Assessment and Plan :  1. Foreign body of right cornea, initial encounter Concern for foreign body of cornea, it appears very superficial but did not move with saline irrigation. Pt denies pain or visual disturbance. No contact lens use. Given Rx for erythromycin ointment. Plan to f/u closely with ophthalmology for further evaluation and removal. Given strict ED precautions.  - Ambulatory referral to Ophthalmology  Meds ordered this encounter  Medications  . erythromycin ophthalmic ointment    Sig: Place 1  application into the right eye 4 (four) times daily for 5 days.    Dispense:  3.5 g    Refill:  0    Order Specific Question:   Supervising Provider    Answer:   Georgina QuintSAGARDIA, MIGUEL JOSE [[id number]]    Benjiman CoreBrittany Wiseman PA-C  Primary Care at [facility]  [facility] 06/01/2018 12:12 PM

## 2018-07-28 ENCOUNTER — Ambulatory Visit (INDEPENDENT_AMBULATORY_CARE_PROVIDER_SITE_OTHER): Payer: Self-pay | Admitting: Family Medicine

## 2018-07-28 ENCOUNTER — Other Ambulatory Visit: Payer: Self-pay

## 2018-07-28 ENCOUNTER — Encounter: Payer: Self-pay | Admitting: Family Medicine

## 2018-07-28 VITALS — BP 102/70 | HR 76 | Temp 98.6°F | Resp 17 | Ht 67.0 in | Wt 230.6 lb

## 2018-07-28 DIAGNOSIS — R0981 Nasal congestion: Secondary | ICD-10-CM

## 2018-07-28 DIAGNOSIS — J069 Acute upper respiratory infection, unspecified: Secondary | ICD-10-CM

## 2018-07-28 DIAGNOSIS — J208 Acute bronchitis due to other specified organisms: Secondary | ICD-10-CM

## 2018-07-28 MED ORDER — AMOXICILLIN-POT CLAVULANATE 875-125 MG PO TABS: 1.0000 | ORAL_TABLET | Freq: Two times a day (BID) | ORAL | 0 refills | Status: DC

## 2018-07-28 NOTE — Progress Notes (Signed)
Acute Office Visit  Subjective:    Patient ID: Erin Pope, female    DOB: 25-Jun-1986, 32 y.o.   MRN: [id number]  Chief Complaint  Patient presents with  . URI    x 1 week, cough, congestion, ha, fatigue and bodyaches.  Drainage is clear, fever earlier in the week but no temps taken.  Taking mucinex, cough syrup and dayquil without relief.  Dry heaving cough and will not loosen in chest.    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  Past Surgical History:  Procedure Laterality Date  . EAR TUBE REMOVAL      No family history on file.  Social History   Socioeconomic History  . Marital status: Single    Spouse name: Not on file  . Number of children: Not on file  . Years of education: Not on file  . Highest education level: Not on file  Occupational History  . Not on file  Social Needs  . Financial resource strain: Not on file  . Food insecurity:    Worry: Not on file    Inability: Not on file  . Transportation needs:    Medical: Not on file    Non-medical: Not on file  Tobacco Use  . Smoking status: Current Every Day Smoker    Packs/day: 0.50    Years: 8.00    Pack years: 4.00    Types: Cigarettes  . Smokeless tobacco: Never Used  Substance and Sexual Activity  . Alcohol use: No    Alcohol/week: 0.0 standard drinks  . Drug use: No  . Sexual activity: Never  Lifestyle  . Physical activity:    Days per week: Not on file    Minutes per session: Not on file  . Stress: Not on file  Relationships  . Social connections:    Talks on phone: Not on file    Gets together: Not on file    Attends religious service: Not on file    Active member of club or organization:  Not on file    Attends meetings of clubs or organizations: Not on file    Relationship status: Not on file  . Intimate partner violence:    Fear of current or ex partner: Not on file    Emotionally abused: Not on file    Physically abused: Not on file    Forced sexual activity: Not on file  Other Topics Concern  . Not on file  Social History Narrative  . Not on file    Outpatient Medications Prior to Visit  Medication Sig Dispense Refill  . albuterol (PROVENTIL HFA;VENTOLIN HFA) 108 (90 Base) MCG/ACT inhaler Inhale 2 puffs into the lungs every 4 (four) hours as needed for wheezing or shortness of breath (cough, shortness of breath or wheezing.). 1 Inhaler 0   No facility-administered medications prior to visit.     Allergies  Allergen Reactions  . Bactrim [Sulfamethoxazole-Trimethoprim] Hives    Review of Systems  Constitutional: Positive for chills and fever.  Respiratory: Positive for cough and shortness of breath.   Cardiovascular: Negative for chest pain, palpitations and orthopnea.  Gastrointestinal: Negative for abdominal  pain, nausea and vomiting.  Genitourinary: Negative for dysuria, frequency and urgency.  Skin: Negative for itching and rash.  Neurological: Negative for dizziness, tingling, tremors and headaches.       Objective:    Physical Exam  BP 102/70 (BP Location: Right Arm, Patient Position: Sitting, Cuff Size: Large)   Pulse 76   Temp 98.6 F (37 C) (Oral)   Resp 17   Ht 5\' 7"  (1.702 m)   Wt 230 lb 9.6 oz (104.6 kg)   LMP 07/19/2018   SpO2 97%   BMI 36.12 kg/m  Wt Readings from Last 3 Encounters:  07/28/18 230 lb 9.6 oz (104.6 kg)  06/01/18 216 lb 3.2 oz (98.1 kg)  06/02/17 252 lb (114.3 kg)   General: alert, oriented, in NAD Head: normocephalic, atraumatic, no sinus tenderness Eyes: EOM intact, no scleral icterus or conjunctival injection Ears: TM clear bilaterally Nose: mucosa nonerythematous, nonedematous Throat: no pharyngeal exudate  or erythema Lymph: no posterior auricular, submental or cervical lymph adenopathy Heart: normal rate, normal sinus rhythm, no murmurs Lungs: clear to auscultation on right, coarse breath sounds on the left, no crackles, no wheezing, no rhonchi   Health Maintenance Due  Topic Date Due  . HIV Screening  06/06/2002  . TETANUS/TDAP  06/06/2006  . PAP SMEAR  06/06/2008  . INFLUENZA VACCINE  04/13/2018    There are no preventive care reminders to display for this patient.      Assessment & Plan:   Problem List Items Addressed This Visit    None    Visit Diagnoses    URI with cough and congestion    -  Primary Discussed supportive care as well as abx Gave Augmentin for bronchitis Pt declined CXR   Relevant Medications   amoxicillin-clavulanate (AUGMENTIN) 875-125 MG tablet   Nasal congestion   - continue flonase and otc cough meds     Relevant Medications   amoxicillin-clavulanate (AUGMENTIN) 875-125 MG tablet   Acute bronchitis due to other specified organisms  - will treat with augmentin Reviewed allergies         Meds ordered this encounter  Medications  . amoxicillin-clavulanate (AUGMENTIN) 875-125 MG tablet    Sig: Take 1 tablet by mouth 2 (two) times daily.    Dispense:  20 tablet    Refill:  0     Doristine BosworthZoe A Azam Gervasi, MD

## 2018-07-28 NOTE — Patient Instructions (Addendum)
   If you have lab work done today you will be contacted with your lab results within the next 2 weeks.  If you have not heard from us then please contact us. The fastest way to get your results is to register for My Chart.   IF you received an x-ray today, you will receive an invoice from [facility]. Please contact [facility] at [phone] with questions or concerns regarding your invoice.   IF you received labwork today, you will receive an invoice from [facility]. Please contact [facility] at [phone] with questions or concerns regarding your invoice.   Our billing staff will not be able to assist you with questions regarding bills from these companies.  You will be contacted with the lab results as soon as they are available. The fastest way to get your results is to activate your My Chart account. Instructions are located on the last page of this paperwork. If you have not heard from us regarding the results in 2 weeks, please contact this office.     Acute Bronchitis, Adult Acute bronchitis is sudden (acute) swelling of the air tubes (bronchi) in the lungs. Acute bronchitis causes these tubes to fill with mucus, which can make it hard to breathe. It can also cause coughing or wheezing. In adults, acute bronchitis usually goes away within 2 weeks. A cough caused by bronchitis may last up to 3 weeks. Smoking, allergies, and asthma can make the condition worse. Repeated episodes of bronchitis may cause further lung problems, such as chronic obstructive pulmonary disease (COPD). What are the causes? This condition can be caused by germs and by substances that irritate the lungs, including:  Cold and flu viruses. This condition is most often caused by the same virus that causes a cold.  Bacteria.  Exposure to tobacco smoke, dust, fumes, and air pollution.  What increases the risk? This condition is more likely to develop in people who:  Have close contact with  someone with acute bronchitis.  Are exposed to lung irritants, such as tobacco smoke, dust, fumes, and vapors.  Have a weak immune system.  Have a respiratory condition such as asthma.  What are the signs or symptoms? Symptoms of this condition include:  A cough.  Coughing up clear, yellow, or green mucus.  Wheezing.  Chest congestion.  Shortness of breath.  A fever.  Body aches.  Chills.  A sore throat.  How is this diagnosed? This condition is usually diagnosed with a physical exam. During the exam, your health care provider may order tests, such as chest X-rays, to rule out other conditions. He or she may also:  Test a sample of your mucus for bacterial infection.  Check the level of oxygen in your blood. This is done to check for pneumonia.  Do a chest X-ray or lung function testing to rule out pneumonia and other conditions.  Perform blood tests.  Your health care provider will also ask about your symptoms and medical history. How is this treated? Most cases of acute bronchitis clear up over time without treatment. Your health care provider may recommend:  Drinking more fluids. Drinking more makes your mucus thinner, which may make it easier to breathe.  Taking a medicine for a fever or cough.  Taking an antibiotic medicine.  Using an inhaler to help improve shortness of breath and to control a cough.  Using a cool mist vaporizer or humidifier to make it easier to breathe.  Follow these instructions at home:   Medicines  Take over-the-counter and prescription medicines only as told by your health care provider.  If you were prescribed an antibiotic, take it as told by your health care provider. Do not stop taking the antibiotic even if you start to feel better. General instructions  Get plenty of rest.  Drink enough fluids to keep your urine clear or pale yellow.  Avoid smoking and secondhand smoke. Exposure to cigarette smoke or irritating  chemicals will make bronchitis worse. If you smoke and you need help quitting, ask your health care provider. Quitting smoking will help your lungs heal faster.  Use an inhaler, cool mist vaporizer, or humidifier as told by your health care provider.  Keep all follow-up visits as told by your health care provider. This is important. How is this prevented? To lower your risk of getting this condition again:  Wash your hands often with soap and water. If soap and water are not available, use hand sanitizer.  Avoid contact with people who have cold symptoms.  Try not to touch your hands to your mouth, nose, or eyes.  Make sure to get the flu shot every year.  Contact a health care provider if:  Your symptoms do not improve in 2 weeks of treatment. Get help right away if:  You cough up blood.  You have chest pain.  You have severe shortness of breath.  You become dehydrated.  You faint or keep feeling like you are going to faint.  You keep vomiting.  You have a severe headache.  Your fever or chills gets worse. This information is not intended to replace advice given to you by your health care provider. Make sure you discuss any questions you have with your health care provider. Document Released: 10/07/2004 Document Revised: 03/24/2016 Document Reviewed: 02/18/2016 Elsevier Interactive Patient Education  2018 [employer]vier Inc[employer]  

## 2018-07-31 ENCOUNTER — Ambulatory Visit: Payer: Self-pay

## 2018-07-31 NOTE — Telephone Encounter (Signed)
Pt. Requesting cough medication be called in - can not sleep due to coughing all night. Cough and chest is tight. Taking her Augmentin and Mucinex. Also requesting a note to be out of work through Thursday 08/03/18. [facility]. [facility] [place].

## 2018-08-01 ENCOUNTER — Telehealth: Payer: Self-pay | Admitting: Family Medicine

## 2018-08-01 NOTE — Telephone Encounter (Signed)
Copied from CRM [phone]#[id number]. Topic: General - Call Back - No Documentation >> Jul 31, 2018  2:04 PM Lynne LoganHudson, Caryn D wrote: Reason for CRM: Pt saw Dr. Creta LevinStallings on 07/28/18. She stated she still feels bad and has been unable to sleep because of a constant cough that keeps her up at night. Would like to know if Dr. Creta LevinStallings can write her a note to keep her out of work for 3 more days so that she can get some rest.

## 2018-08-03 NOTE — Telephone Encounter (Signed)
Please see note below. 

## 2018-08-06 MED ORDER — BENZONATATE 100 MG PO CAPS: 100.0000 mg | ORAL_CAPSULE | Freq: Two times a day (BID) | ORAL | 1 refills | Status: DC | PRN

## 2018-08-06 NOTE — Telephone Encounter (Signed)
Completed work note. Sent tessalon perles to the pharmacy. Please notify the patient.

## 2018-08-06 NOTE — Addendum Note (Signed)
Addended by: Collie SiadSTALLINGS, Mittie Knittel A on: 08/06/2018 07:38 PM   Modules accepted: Orders

## 2020-08-05 ENCOUNTER — Encounter: Payer: Self-pay | Admitting: Emergency Medicine

## 2020-08-05 ENCOUNTER — Telehealth (INDEPENDENT_AMBULATORY_CARE_PROVIDER_SITE_OTHER): Payer: Managed Care, Other (non HMO) | Admitting: Emergency Medicine

## 2020-08-05 ENCOUNTER — Other Ambulatory Visit: Payer: Self-pay

## 2020-08-05 DIAGNOSIS — R059 Cough, unspecified: Secondary | ICD-10-CM | POA: Diagnosis not present

## 2020-08-05 DIAGNOSIS — J22 Unspecified acute lower respiratory infection: Secondary | ICD-10-CM | POA: Diagnosis not present

## 2020-08-05 MED ORDER — BENZONATATE 200 MG PO CAPS: 200.0000 mg | ORAL_CAPSULE | Freq: Two times a day (BID) | ORAL | 0 refills | Status: DC | PRN

## 2020-08-05 MED ORDER — HYDROCODONE-HOMATROPINE 5-1.5 MG/5ML PO SYRP: 5.0000 mL | ORAL_SOLUTION | Freq: Every evening | ORAL | 0 refills | Status: DC | PRN

## 2020-08-05 MED ORDER — AZITHROMYCIN 250 MG PO TABS: ORAL_TABLET | ORAL | 0 refills | Status: DC

## 2020-08-05 NOTE — Progress Notes (Addendum)
Telemedicine Encounter- SOAP NOTE Established Patient my chart video encounter Patient: Home  Provider: Office     This video telephone encounter was conducted with the patient's (or proxy's) verbal consent via video audio telecommunications: yes/no: Yes Patient was instructed to have this encounter in a suitably private space; and to only have persons present to whom they give permission to participate. In addition, patient identity was confirmed by use of name plus two identifiers (DOB and address).  I discussed the limitations, risks, security and privacy concerns of performing an evaluation and management service by telephone and the availability of in person appointments. I also discussed with the patient that there may be a patient responsible charge related to this service. The patient expressed understanding and agreed to proceed.  I spent a total of TIME; 0 MIN TO 60 MIN: 20 minutes talking with the patient or their proxy.  Chief Complaint  Patient presents with  . Cough    has been having productive cough since Saturday, works in long term facility. Get tested 2x wkly for covid, result of rapid test was -. Also having pcr testing done, test is pendig. pt taking dayquil and sudsfed for the sx. Getting better, just coughing. Pt does smoke as well.    Subjective   Erin Pope is a 34 y.o. female established patient. Telephone visit today complaining of sinus pressure, chest congestion, and cough that started 4 days ago.  Denies difficulty breathing, fever, chills, nausea or vomiting, or any other associated symptoms.  HPI   There are no problems to display for this patient.   History reviewed. No pertinent past medical history.  No current outpatient medications on file.   No current facility-administered medications for this visit.    Allergies  Allergen Reactions  . Bactrim [Sulfamethoxazole-Trimethoprim] Hives    Social History   Socioeconomic History  .  Marital status: Single    Spouse name: Not on file  . Number of children: Not on file  . Years of education: Not on file  . Highest education level: Not on file  Occupational History  . Not on file  Tobacco Use  . Smoking status: Current Every Day Smoker    Packs/day: 0.50    Years: 8.00    Pack years: 4.00    Types: Cigarettes  . Smokeless tobacco: Never Used  Substance and Sexual Activity  . Alcohol use: No    Alcohol/week: 0.0 standard drinks  . Drug use: No  . Sexual activity: Never  Other Topics Concern  . Not on file  Social History Narrative  . Not on file   Social Determinants of Health   Financial Resource Strain:   . Difficulty of Paying Living Expenses: Not on file  Food Insecurity:   . Worried About Programme researcher, broadcasting/film/video in the Last Year: Not on file  . Ran Out of Food in the Last Year: Not on file  Transportation Needs:   . Lack of Transportation (Medical): Not on file  . Lack of Transportation (Non-Medical): Not on file  Physical Activity:   . Days of Exercise per Week: Not on file  . Minutes of Exercise per Session: Not on file  Stress:   . Feeling of Stress : Not on file  Social Connections:   . Frequency of Communication with Friends and Family: Not on file  . Frequency of Social Gatherings with Friends and Family: Not on file  . Attends Religious Services: Not on file  .  Active Member of Clubs or Organizations: Not on file  . Attends Banker Meetings: Not on file  . Marital Status: Not on file  Intimate Partner Violence:   . Fear of Current or Ex-Partner: Not on file  . Emotionally Abused: Not on file  . Physically Abused: Not on file  . Sexually Abused: Not on file    Review of Systems  Constitutional: Negative.  Negative for chills and fever.  HENT: Positive for congestion. Negative for sore throat.   Respiratory: Positive for cough and sputum production. Negative for shortness of breath and wheezing.   Cardiovascular: Negative.   Negative for chest pain and palpitations.  Gastrointestinal: Negative.  Negative for abdominal pain, diarrhea, nausea and vomiting.  Genitourinary: Negative.  Negative for dysuria and hematuria.  Skin: Negative.  Negative for rash.  Neurological: Negative.   All other systems reviewed and are negative.   Objective  Alert and oriented x3 in no apparent respiratory distress Vitals as reported by the patient: There were no vitals filed for this visit.  There are no diagnoses linked to this encounter. Kaylina was seen today for cough.  Diagnoses and all orders for this visit:  Cough -     benzonatate (TESSALON) 200 MG capsule; Take 1 capsule (200 mg total) by mouth 2 (two) times daily as needed for cough. -     HYDROcodone-homatropine (HYCODAN) 5-1.5 MG/5ML syrup; Take 5 mLs by mouth at bedtime as needed for cough.  Lower respiratory infection -     azithromycin (ZITHROMAX) 250 MG tablet; Sig as indicated  Clinically stable.  No red flag signs or symptoms.  Advised to contact the office if no better or worse during the next several days.   I discussed the assessment and treatment plan with the patient. The patient was provided an opportunity to ask questions and all were answered. The patient agreed with the plan and demonstrated an understanding of the instructions.   The patient was advised to call back or seek an in-person evaluation if the symptoms worsen or if the condition fails to improve as anticipated.  I provided 20 minutes of non-face-to-face time during this encounter.  Georgina Quint, MD  Primary Care at [facility]

## 2020-10-01 ENCOUNTER — Telehealth (INDEPENDENT_AMBULATORY_CARE_PROVIDER_SITE_OTHER): Payer: Managed Care, Other (non HMO) | Admitting: Registered Nurse

## 2020-10-01 ENCOUNTER — Encounter: Payer: Self-pay | Admitting: Registered Nurse

## 2020-10-01 ENCOUNTER — Other Ambulatory Visit: Payer: Self-pay

## 2020-10-01 VITALS — Ht 67.0 in | Wt 260.0 lb

## 2020-10-01 DIAGNOSIS — Z30432 Encounter for removal of intrauterine contraceptive device: Secondary | ICD-10-CM | POA: Diagnosis not present

## 2020-10-01 NOTE — Patient Instructions (Signed)
° ° ° °  If you have lab work done today you will be contacted with your lab results within the next 2 weeks.  If you have not heard from us then please contact us. The fastest way to get your results is to register for My Chart. ° ° °IF you received an x-ray today, you will receive an invoice from [facility]. Please contact [facility] at [phone] with questions or concerns regarding your invoice.  ° °IF you received labwork today, you will receive an invoice from [facility]. Please contact [facility] at [phone] with questions or concerns regarding your invoice.  ° °Our billing staff will not be able to assist you with questions regarding bills from these companies. ° °You will be contacted with the lab results as soon as they are available. The fastest way to get your results is to activate your My Chart account. Instructions are located on the last page of this paperwork. If you have not heard from us regarding the results in 2 weeks, please contact this office. °  ° ° ° °

## 2020-11-17 ENCOUNTER — Other Ambulatory Visit (HOSPITAL_COMMUNITY)
Admission: RE | Admit: 2020-11-17 | Discharge: 2020-11-17 | Disposition: A | Discharge status: Home / Self Care | Payer: Managed Care, Other (non HMO) | Source: Ambulatory Visit | Attending: Nurse Practitioner | Admitting: Nurse Practitioner

## 2020-11-17 ENCOUNTER — Other Ambulatory Visit: Payer: Self-pay

## 2020-11-17 ENCOUNTER — Encounter: Payer: Self-pay | Admitting: Nurse Practitioner

## 2020-11-17 ENCOUNTER — Ambulatory Visit (INDEPENDENT_AMBULATORY_CARE_PROVIDER_SITE_OTHER): Payer: Managed Care, Other (non HMO) | Admitting: Nurse Practitioner

## 2020-11-17 VITALS — BP 128/72 | HR 94 | Resp 14 | Ht 65.75 in | Wt 268.0 lb

## 2020-11-17 DIAGNOSIS — Z30431 Encounter for routine checking of intrauterine contraceptive device: Secondary | ICD-10-CM | POA: Diagnosis not present

## 2020-11-17 DIAGNOSIS — Z01419 Encounter for gynecological examination (general) (routine) without abnormal findings: Secondary | ICD-10-CM

## 2020-11-17 DIAGNOSIS — Z720 Tobacco use: Secondary | ICD-10-CM

## 2020-11-17 NOTE — Progress Notes (Signed)
35 y.o. G2P1. (one baby given away for adoption, one TAB) White or Caucasian female here for annual exam.      Mirena IUD placed in 2014 PCP Janeece Agee)  told her to come for GYN care to get removed. Last GYN physical done in [place] Using extra protection.  Sometimes has symptoms like a period is coming, has cramping, but randomly and rarely gets periods. Last month had a light period lasting only 12 hours  (10/31/2020).  Works in [employer] [facility]. Very short staffed.   No LMP recorded. (Menstrual status: IUD).          Sexually active: Yes.    The current method of family planning is IUD.    Exercising: No.  The patient has a physically strenuous job, but has no regular exercise apart from work.  Smoker:  yes  Health Maintenance: Pap:  2014 [place] History of abnormal Pap:  Yes (2008, repeat pap was normal) TDaP:  Unsure  Gardasil:   none Covid-19: no Hep C testing: donated blood in the past  Screening Labs: discuss with provider    reports that she has been smoking cigarettes. She has a 5.00 pack-year smoking history. She has never used smokeless tobacco. She reports current alcohol use. She reports that she does not use drugs.  Past Medical History:  Diagnosis Date  . Murmur    resolved    Past Surgical History:  Procedure Laterality Date  . TYMPANOSTOMY TUBE PLACEMENT     as a child    Current Outpatient Medications  Medication Sig Dispense Refill  . Acetaminophen (TYLENOL PO) Take by mouth as needed.    Marland Kitchen levonorgestrel (MIRENA) 20 MCG/24HR IUD 1 each by Intrauterine route once. Placed 05-2013     No current facility-administered medications for this visit.    History reviewed. No pertinent family history.  Review of Systems  All other systems reviewed and are negative.   Exam:   BP 128/72 (BP Location: Right Arm, Patient Position: Sitting, Cuff Size: Large)   Pulse 94   Resp 14   Ht 5' 5.75" (1.67 m)   Wt 268 lb (121.6 kg)   BMI 43.59  kg/m   Height: 5' 5.75" (167 cm)  General appearance: alert, cooperative and appears stated age, no acute distress Head: Normocephalic, without obvious abnormality Neck: no adenopathy, thyroid normal to inspection and palpation Lungs: clear to auscultation bilaterally Breasts: No axillary or supraclavicular adenopathy, Normal to palpation without dominant masses Heart: regular rate and rhythm Abdomen: soft, non-tender; no masses,  no organomegaly Extremities: extremities normal, no edema Skin: No rashes or lesions Lymph nodes: Cervical, supraclavicular, and axillary nodes normal. No abnormal inguinal nodes palpated Neurologic: Grossly normal   Pelvic: External genitalia:  no lesions              Urethra:  normal appearing urethra with no masses, tenderness or lesions              Bartholins and Skenes: normal                 Vagina: normal appearing vagina, appropriate for age, normal appearing discharge, no lesions              Cervix: neg cervical motion tenderness, no visible lesions             Bimanual Exam:   Uterus:  normal size, contour, position, consistency, mobility, non-tender  Adnexa: no mass, fullness, tenderness                 Irving Burton, RN Chaperone was present for exam.  A:  Well woman exam - Plan: Cytology - PAP( [facility])  Tobacco abuse- discussed cessation  Surveillance of previously prescribed intrauterine contraceptive device - Plan: IUD Insertion Will plan Mirena IUD ASAP, continue to use back up contraception    P:   Pap :obtained today  Labs: has PCP  Medications: no new today

## 2020-11-17 NOTE — Patient Instructions (Addendum)
Health Maintenance, Female Adopting a healthy lifestyle and getting preventive care are important in promoting health and wellness. Ask your health care provider about:  The right schedule for you to have regular tests and exams.  Things you can do on your own to prevent diseases and keep yourself healthy. What should I know about diet, weight, and exercise? Eat a healthy diet  Eat a diet that includes plenty of vegetables, fruits, low-fat dairy products, and lean protein.  Do not eat a lot of foods that are high in solid fats, added sugars, or sodium.   Maintain a healthy weight Body mass index (BMI) is used to identify weight problems. It estimates body fat based on height and weight. Your health care provider can help determine your BMI and help you achieve or maintain a healthy weight. Get regular exercise Get regular exercise. This is one of the most important things you can do for your health. Most adults should:  Exercise for at least 150 minutes each week. The exercise should increase your heart rate and make you sweat (moderate-intensity exercise).  Do strengthening exercises at least twice a week. This is in addition to the moderate-intensity exercise.  Spend less time sitting. Even light physical activity can be beneficial. Watch cholesterol and blood lipids Have your blood tested for lipids and cholesterol at 34 years of age, then have this test every 5 years. Have your cholesterol levels checked more often if:  Your lipid or cholesterol levels are high.  You are older than 34 years of age.  You are at high risk for heart disease. What should I know about cancer screening? Depending on your health history and family history, you may need to have cancer screening at various ages. This may include screening for:  Breast cancer.  Cervical cancer.  Colorectal cancer.  Skin cancer.  Lung cancer. What should I know about heart disease, diabetes, and high blood  pressure? Blood pressure and heart disease  High blood pressure causes heart disease and increases the risk of stroke. This is more likely to develop in people who have high blood pressure readings, are of African descent, or are overweight.  Have your blood pressure checked: ? Every 3-5 years if you are 34-39 years of age. ? Every year if you are 40 years old or older. Diabetes Have regular diabetes screenings. This checks your fasting blood sugar level. Have the screening done:  Once every three years after age 34 if you are at a normal weight and have a low risk for diabetes.  More often and at a younger age if you are overweight or have a high risk for diabetes. What should I know about preventing infection? Hepatitis B If you have a higher risk for hepatitis B, you should be screened for this virus. Talk with your health care provider to find out if you are at risk for hepatitis B infection. Hepatitis C Testing is recommended for:  Everyone born from 1945 through 1965.  Anyone with known risk factors for hepatitis C. Sexually transmitted infections (STIs)  Get screened for STIs, including gonorrhea and chlamydia, if: ? You are sexually active and are younger than 34 years of age. ? You are older than 34 years of age and your health care provider tells you that you are at risk for this type of infection. ? Your sexual activity has changed since you were last screened, and you are at increased risk for chlamydia or gonorrhea. Ask your health care provider   if you are at risk.  Ask your health care provider about whether you are at high risk for HIV. Your health care provider may recommend a prescription medicine to help prevent HIV infection. If you choose to take medicine to prevent HIV, you should first get tested for HIV. You should then be tested every 3 months for as long as you are taking the medicine. Pregnancy  If you are about to stop having your period (premenopausal) and  you may become pregnant, seek counseling before you get pregnant.  Take 400 to 800 micrograms (mcg) of folic acid every day if you become pregnant.  Ask for birth control (contraception) if you want to prevent pregnancy. Osteoporosis and menopause Osteoporosis is a disease in which the bones lose minerals and strength with aging. This can result in bone fractures. If you are 34 years old or older, or if you are at risk for osteoporosis and fractures, ask your health care provider if you should:  Be screened for bone loss.  Take a calcium or vitamin D supplement to lower your risk of fractures.  Be given hormone replacement therapy (HRT) to treat symptoms of menopause. Follow these instructions at home: Lifestyle  Do not use any products that contain nicotine or tobacco, such as cigarettes, e-cigarettes, and chewing tobacco. If you need help quitting, ask your health care provider.  Do not use street drugs.  Do not share needles.  Ask your health care provider for help if you need support or information about quitting drugs. Alcohol use  Do not drink alcohol if: ? Your health care provider tells you not to drink. ? You are pregnant, may be pregnant, or are planning to become pregnant.  If you drink alcohol: ? Limit how much you use to 0-1 drink a day. ? Limit intake if you are breastfeeding.  Be aware of how much alcohol is in your drink. In the [place] one drink equals one 12 oz bottle of beer (355 mL), one 5 oz glass of wine (148 mL), or one 1 oz glass of hard liquor (44 mL). General instructions  Schedule regular health, dental, and eye exams.  Stay current with your vaccines.  Tell your health care provider if: ? You often feel depressed. ? You have ever been abused or do not feel safe at home. Summary  Adopting a healthy lifestyle and getting preventive care are important in promoting health and wellness.  Follow your health care provider's instructions about healthy  diet, exercising, and getting tested or screened for diseases.  Follow your health care provider's instructions on monitoring your cholesterol and blood pressure. This information is not intended to replace advice given to you by your health care provider. Make sure you discuss any questions you have with your health care provider. Document Revised: 08/23/2018 Document Reviewed: 08/23/2018 Elsevier Patient Education  2021 Elsevier Inc.  Managing the Challenge of Quitting Smoking Quitting smoking is a physical and mental challenge. You will face cravings, withdrawal symptoms, and temptation. Before quitting, work with your health care provider to make a plan that can help you manage quitting. Preparation can help you quit and keep you from giving in. How to manage lifestyle changes Managing stress Stress can make you want to smoke, and wanting to smoke may cause stress. It is important to find ways to manage your stress. You might try some of the following:  Practice relaxation techniques. ? Breathe slowly and deeply, in through your nose and out through your mouth. ?  Listen to music. ? Soak in a bath or take a shower. ? Imagine a peaceful place or vacation.  Get some support. ? Talk with family or friends about your stress. ? Join a support group. ? Talk with a counselor or therapist.  Get some physical activity. ? Go for a walk, run, or bike ride. ? Play a favorite sport. ? Practice yoga.   Medicines Talk with your health care provider about medicines that might help you deal with cravings and make quitting easier for you. Relationships Social situations can be difficult when you are quitting smoking. To manage this, you can:  Avoid parties and other social situations where people might be smoking.  Avoid alcohol.  Leave right away if you have the urge to smoke.  Explain to your family and friends that you are quitting smoking. Ask for support and let them know you might be a  bit grumpy.  Plan activities where smoking is not an option. General instructions Be aware that many people gain weight after they quit smoking. However, not everyone does. To keep from gaining weight, have a plan in place before you quit and stick to the plan after you quit. Your plan should include:  Having healthy snacks. When you have a craving, it may help to: ? Eat popcorn, carrots, celery, or other cut vegetables. ? Chew sugar-free gum.  Changing how you eat. ? Eat small portion sizes at meals. ? Eat 4-6 small meals throughout the day instead of 1-2 large meals a day. ? Be mindful when you eat. Do not watch television or do other things that might distract you as you eat.  Exercising regularly. ? Make time to exercise each day. If you do not have time for a long workout, do short bouts of exercise for 5-10 minutes several times a day. ? Do some form of strengthening exercise, such as weight lifting. ? Do some exercise that gets your heart beating and causes you to breathe deeply, such as walking fast, running, swimming, or biking. This is very important.  Drinking plenty of water or other low-calorie or no-calorie drinks. Drink 6-8 glasses of water daily.   How to recognize withdrawal symptoms Your body and mind may experience discomfort as you try to get used to not having nicotine in your system. These effects are called withdrawal symptoms. They may include:  Feeling hungrier than normal.  Having trouble concentrating.  Feeling irritable or restless.  Having trouble sleeping.  Feeling depressed.  Craving a cigarette. To manage withdrawal symptoms:  Avoid places, people, and activities that trigger your cravings.  Remember why you want to quit.  Get plenty of sleep.  Avoid coffee and other caffeinated drinks. These may worsen some of your symptoms. These symptoms may surprise you. But be assured that they are normal to have when quitting smoking. How to manage  cravings Come up with a plan for how to deal with your cravings. The plan should include the following:  A definition of the specific situation you want to deal with.  An alternative action you will take.  A clear idea for how this action will help.  The name of someone who might help you with this. Cravings usually last for 5-10 minutes. Consider taking the following actions to help you with your plan to deal with cravings:  Keep your mouth busy. ? Chew sugar-free gum. ? Suck on hard candies or a straw. ? Brush your teeth.  Keep your hands and body busy. ?  Change to a different activity right away. ? Squeeze or play with a ball. ? Do an activity or a hobby, such as making bead jewelry, practicing needlepoint, or working with wood. ? Mix up your normal routine. ? Take a short exercise break. Go for a quick walk or run up and down stairs.  Focus on doing something kind or helpful for someone else.  Call a friend or family member to talk during a craving.  Join a support group.  Contact a quitline. Where to find support To get help or find a support group:  Call the National Cancer Institute's Smoking Quitline: 1-800-QUIT NOW 385-282-9350)  Visit the website of the Substance Abuse and Mental Health Services Administration: SkateOasis.com.pt  Text QUIT to SmokefreeTXT: 762831 Where to find more information Visit these websites to find more information on quitting smoking:  National Cancer Institute: www.smokefree.gov  American Lung Association: www.lung.org  American Cancer Society: www.cancer.org  Centers for Disease Control and Prevention: FootballExhibition.com.br  American Heart Association: www.heart.org Contact a health care provider if:  You want to change your plan for quitting.  The medicines you are taking are not helping.  Your eating feels out of control or you cannot sleep. Get help right away if:  You feel depressed or become very anxious. Summary  Quitting  smoking is a physical and mental challenge. You will face cravings, withdrawal symptoms, and temptation to smoke again. Preparation can help you as you go through these challenges.  Try different techniques to manage stress, handle social situations, and prevent weight gain.  You can deal with cravings by keeping your mouth busy (such as by chewing gum), keeping your hands and body busy, calling family or friends, or contacting a quitline for people who want to quit smoking.  You can deal with withdrawal symptoms by avoiding places where people smoke, getting plenty of rest, and avoiding drinks with caffeine. This information is not intended to replace advice given to you by your health care provider. Make sure you discuss any questions you have with your health care provider. Document Revised: 06/19/2019 Document Reviewed: 06/19/2019 Elsevier Patient Education  2021 [employer].

## 2020-11-19 LAB — CYTOLOGY - PAP
Comment: NEGATIVE
Diagnosis: NEGATIVE
High risk HPV: NEGATIVE

## 2020-11-20 ENCOUNTER — Ambulatory Visit (INDEPENDENT_AMBULATORY_CARE_PROVIDER_SITE_OTHER): Payer: Managed Care, Other (non HMO) | Admitting: Nurse Practitioner

## 2020-11-20 ENCOUNTER — Other Ambulatory Visit: Payer: Self-pay

## 2020-11-20 ENCOUNTER — Encounter: Payer: Self-pay | Admitting: Nurse Practitioner

## 2020-11-20 VITALS — BP 104/70 | HR 70 | Resp 16 | Wt 268.0 lb

## 2020-11-20 DIAGNOSIS — Z01812 Encounter for preprocedural laboratory examination: Secondary | ICD-10-CM

## 2020-11-20 DIAGNOSIS — Z30433 Encounter for removal and reinsertion of intrauterine contraceptive device: Secondary | ICD-10-CM | POA: Diagnosis not present

## 2020-11-20 LAB — PREGNANCY, URINE: Preg Test, Ur: NEGATIVE

## 2020-11-20 NOTE — Patient Instructions (Signed)

## 2020-11-20 NOTE — Progress Notes (Signed)
35 y.o. G11P0011 Engaged  female presents for removal of Mirena IUD and re-insertion of IUD.  She feels IUD is the best option for her.  Pt has also been counseled about risks and benefits as well as complications.  Consent is obtained today.  All questions answered prior to start of procedure.   Recent STD testing:  decline LMP:  No LMP recorded. (Menstrual status: IUD).  There are no problems to display for this patient.  Past Medical History:  Diagnosis Date  . Murmur    resolved   Current Outpatient Medications on File Prior to Visit  Medication Sig Dispense Refill  . Acetaminophen (TYLENOL PO) Take by mouth as needed.    Marland Kitchen levonorgestrel (MIRENA) 20 MCG/24HR IUD 1 each by Intrauterine route once. Placed 05-2013     No current facility-administered medications on file prior to visit.   Bactrim [sulfamethoxazole-trimethoprim]  ROS Vitals:   11/20/20 0837  BP: 104/70  Pulse: 70  Resp: 16  Weight: 268 lb (121.6 kg)    Gen:  [facility] healthy female NAD Abdomen: soft, non-tender  Pelvic exam: Vulva:  normal female genitalia Vagina:  normal vagina, no discharge, exudate, lesion, or erythema Cervix:  Non-tender, Negative CMT, no lesions or redness. Uterus:  normal shape, position and consistency   Procedure:  Speculum reinserted.  Cervix visualized and cleansed with Betadine x 3.  Single toothed tenaculum applied to anterior lip of cervix without difficulty.    IUD string noted and grasped with ringed forcep.  With one pull, IUD removed easily.  Pt has some mild cramping but tolerated this well.  Then uterus sounded to 8cm.  Lot number: [id number].  Expiration:  12/2022.  IUD package was opened.  IUD inserted easily. Strings trimmed to 3cm.  Tenaculum removed from cervix.  Minimal bleeding noted.  Pt tolerated the procedure well.   A: Removal of Mirena IUD and reinsertion of MirenaIUD   P:  Return for recheck 4-6 weeks Pt aware to call for any concerns Pt aware removal due no  later than 11/2027.  IUD card given to pt.

## 2020-12-18 NOTE — Progress Notes (Deleted)
GYNECOLOGY  VISIT  CC:   ***  HPI: 35 y.o. G2P0011 Significant Other White or Caucasian female here for iud follow up.     GYNECOLOGIC HISTORY: No LMP recorded. (Menstrual status: IUD). Contraception: mirena iud inserted 11-20-2020 Menopausal hormone therapy: none  There are no problems to display for this patient.   Past Medical History:  Diagnosis Date  . Murmur    resolved    Past Surgical History:  Procedure Laterality Date  . TYMPANOSTOMY TUBE PLACEMENT     as a child    MEDS:   Current Outpatient Medications on File Prior to Visit  Medication Sig Dispense Refill  . Acetaminophen (TYLENOL PO) Take by mouth as needed.    Marland Kitchen levonorgestrel (MIRENA) 20 MCG/24HR IUD 1 each by Intrauterine route once. Placed 05-2013     No current facility-administered medications on file prior to visit.    ALLERGIES: Bactrim [sulfamethoxazole-trimethoprim]  No family history on file.   Review of Systems  PHYSICAL EXAMINATION:    There were no vitals taken for this visit.    General appearance: alert, cooperative, no acute distress  CV:  {Exam; heart brief:31539} Lungs:  {pe lungs ob:314451::"clear to auscultation, no wheezes, rales or rhonchi, symmetric air entry"} Breasts: {Exam; breast:13139::"normal appearance, no masses or tenderness"} Abdomen: soft, non-tender; no masses,  no organomegaly Lymph:  no inguinal LAD noted  Pelvic: External genitalia:  no lesions              Urethra:  normal appearing urethra with no masses, tenderness or lesions              Bartholins and Skenes: normal                 Vagina: normal appearing vagina               Cervix: {CHL AMB PHY EX CERVIX NORM DEFAULT:2040340069::"no lesions"}              Bimanual Exam:  Uterus:  {CHL AMB PHY EX UTERUS NORM DEFAULT:3104376300::"normal size, contour, position, consistency, mobility, non-tender"}              Adnexa: {CHL AMB PHY EX ADNEXA NO MASS DEFAULT:[phone]::"no mass, fullness,  tenderness"}               Chaperone, ***, CMA, was present for exam.  Assessment: ***  Plan: ***   {NUMBERS; -10-45 JOINT ROM:10287} minutes of total time was spent for this patient encounter, including preparation, face-to-face counseling with the patient and coordination of care, and documentation of the encounter.

## 2020-12-22 ENCOUNTER — Ambulatory Visit: Payer: Managed Care, Other (non HMO) | Admitting: Nurse Practitioner

## 2021-10-31 NOTE — Progress Notes (Signed)
Established Patient Office Visit  Subjective:  Patient ID: Erin Pope, female    DOB: 11-11-1985  Age: 36 y.o. MRN: [id number]  CC:  Chief Complaint  Patient presents with   Transitions Of Care    Iud is overdue     HPI Erin Pope presents for [facility]  IUD removal Overdue No concerns Does not want to replace - wants to take break from hormonal contraception.  No other concerns.  Past Medical History:  Diagnosis Date   Murmur    resolved    Past Surgical History:  Procedure Laterality Date   TYMPANOSTOMY TUBE PLACEMENT     as a child    History reviewed. No pertinent family history.  Social History   Socioeconomic History   Marital status: Significant Other    Spouse name: Not on file   Number of children: 0   Years of education: Not on file   Highest education level: Not on file  Occupational History   Not on file  Tobacco Use   Smoking status: Every Day    Packs/day: 0.50    Years: 10.00    Pack years: 5.00    Types: Cigarettes   Smokeless tobacco: Never  Vaping Use   Vaping Use: Never used  Substance and Sexual Activity   Alcohol use: Yes    Alcohol/week: 0.0 standard drinks    Comment: social    Drug use: No   Sexual activity: Yes    Birth control/protection: I.U.D.  Other Topics Concern   Not on file  Social History Narrative   Not on file   Social Determinants of Health   Financial Resource Strain: Not on file  Food Insecurity: Not on file  Transportation Needs: Not on file  Physical Activity: Not on file  Stress: Not on file  Social Connections: Not on file  Intimate Partner Violence: Not on file    Outpatient Medications Prior to Visit  Medication Sig Dispense Refill   azithromycin (ZITHROMAX) 250 MG tablet Sig as indicated 6 tablet 0   benzonatate (TESSALON) 200 MG capsule Take 1 capsule (200 mg total) by mouth 2 (two) times daily as needed for cough. 20 capsule 0   HYDROcodone-homatropine (HYCODAN) 5-1.5 MG/5ML syrup Take 5 mLs  by mouth at bedtime as needed for cough. 120 mL 0   No facility-administered medications prior to visit.    Allergies  Allergen Reactions   Bactrim [Sulfamethoxazole-Trimethoprim] Hives    ROS Review of Systems  Constitutional: Negative.   HENT: Negative.    Eyes: Negative.   Respiratory: Negative.    Cardiovascular: Negative.   Gastrointestinal: Negative.   Genitourinary: Negative.   Musculoskeletal: Negative.   Skin: Negative.   Neurological: Negative.   Psychiatric/Behavioral: Negative.    All other systems reviewed and are negative.    Objective:    Physical Exam Vitals and nursing note reviewed.  Constitutional:      General: She is not in acute distress.    Appearance: Normal appearance. She is normal weight. She is not ill-appearing, toxic-appearing or diaphoretic.  Cardiovascular:     Rate and Rhythm: Normal rate and regular rhythm.     Heart sounds: Normal heart sounds. No murmur heard.   No friction rub. No gallop.  Pulmonary:     Effort: Pulmonary effort is normal. No respiratory distress.     Breath sounds: Normal breath sounds. No stridor. No wheezing, rhonchi or rales.  Chest:     Chest wall: No tenderness.  Genitourinary:  General: Normal vulva.     Vagina: No vaginal discharge.     Rectum: Normal. Guaiac result negative.  Skin:    General: Skin is warm and dry.  Neurological:     General: No focal deficit present.     Mental Status: She is alert and oriented to person, place, and time. Mental status is at baseline.  Psychiatric:        Mood and Affect: Mood normal.        Behavior: Behavior normal.        Thought Content: Thought content normal.        Judgment: Judgment normal.    Ht _0  (1.702 m)    Wt 260 lb (117.9 kg)    BMI 40.72 kg/m  Wt Readings from Last 3 Encounters:  11/20/20 268 lb (121.6 kg)  11/17/20 268 lb (121.6 kg)  10/01/20 260 lb (117.9 kg)     Health Maintenance Due  Topic Date Due   COVID-19 Vaccine (1) Never  done   HIV Screening  Never done   Hepatitis C Screening  Never done   TETANUS/TDAP  Never done   INFLUENZA VACCINE  Never done    There are no preventive care reminders to display for this patient.  No results found for: TSH No results found for: WBC, HGB, HCT, MCV, PLT No results found for: NA, K, CHLORIDE, CO2, GLUCOSE, BUN, CREATININE, BILITOT, ALKPHOS, AST, ALT, PROT, ALBUMIN, CALCIUM, ANIONGAP, EGFR, GFR No results found for: CHOL No results found for: HDL No results found for: LDLCALC No results found for: TRIG No results found for: CHOLHDL No results found for: HGBA1C  Procedure Iud removal.  Pt consented on risks, benefits, alternatives Voices understanding, signs consent IUD strings visualized. Cervix appears wnl. Removed with ring forceps Pt tolerated procedure well. No AE.     Assessment & Plan:   Problem List Items Addressed This Visit   None Visit Diagnoses     Encounter for IUD removal    -  Primary       No orders of the defined types were placed in this encounter.   Follow-up: No follow-ups on file.   PLAN Iud removed Recommend condoms or abstinence for contraception Return for cpe and labs at pt convenience.  Erin Coss, NP

## 2021-11-06 ENCOUNTER — Other Ambulatory Visit: Payer: Self-pay

## 2021-11-06 ENCOUNTER — Ambulatory Visit
Admission: EM | Admit: 2021-11-06 | Discharge: 2021-11-06 | Disposition: A | Discharge status: Home / Self Care | Payer: Managed Care, Other (non HMO) | Attending: Emergency Medicine | Admitting: Emergency Medicine

## 2021-11-06 DIAGNOSIS — J310 Chronic rhinitis: Secondary | ICD-10-CM | POA: Diagnosis not present

## 2021-11-06 DIAGNOSIS — J069 Acute upper respiratory infection, unspecified: Secondary | ICD-10-CM | POA: Diagnosis not present

## 2021-11-06 DIAGNOSIS — J329 Chronic sinusitis, unspecified: Secondary | ICD-10-CM

## 2021-11-06 DIAGNOSIS — R509 Fever, unspecified: Secondary | ICD-10-CM | POA: Diagnosis not present

## 2021-11-06 DIAGNOSIS — J0140 Acute pansinusitis, unspecified: Secondary | ICD-10-CM | POA: Diagnosis not present

## 2021-11-06 MED ORDER — IBUPROFEN 800 MG PO TABS
800.0000 mg | ORAL_TABLET | Freq: Once | ORAL | Status: AC
  Administered 2021-11-06: 800 mg via ORAL

## 2021-11-06 MED ORDER — IPRATROPIUM BROMIDE 0.06 % NA SOLN: 2.0000 | Freq: Four times a day (QID) | NASAL | 0 refills | Status: DC

## 2021-11-06 MED ORDER — AZITHROMYCIN 250 MG PO TABS: 500.0000 mg | ORAL_TABLET | Freq: Every day | ORAL | 0 refills | Status: AC

## 2021-11-06 MED ORDER — METHYLPREDNISOLONE 4 MG PO TBPK
ORAL_TABLET | ORAL | 0 refills | Status: DC
Start: 2021-11-06 — End: 2022-06-21

## 2021-11-06 MED ORDER — GUAIFENESIN 400 MG PO TABS: ORAL_TABLET | ORAL | 0 refills | Status: DC

## 2021-11-06 NOTE — Discharge Instructions (Addendum)
Your symptoms and physical exam findings are concerning for a viral upper respiratory infection that is on the precipice of converting into a bacterial sinus infection.  Because you are having pain on your face that is radiating around your head like a vice grip, I am concerned that your sinus infection is rapidly progressing towards a bacterial infection.  The perles of treatment for sinus infection is sterilization, anti-inflammation and expectoration.  Please see the list below for recommended medications, dosages and frequencies to provide relief of your current symptoms:    Azithromycin: Please take 2 tablets once daily for the next 3 days.  You can start this today.  Methylprednisolone (Medrol Dosepak): Please take one row of tablets daily with your breakfast meal starting tomorrow morning until the prescription is complete.   This is a steroid that will significantly calm your upper and lower airways.     Ipratropium (Atrovent): This is an excellent nasal decongestant spray that does not cause rebound congestion, can be used up to 4 times daily as needed, instill 2 sprays into each nare with each use.  I have provided you with a prescription for this medication.      Guaifenesin (Robitussin, Mucinex): This is an expectorant.  This helps break up chest congestion and loosen up thick nasal drainage making phlegm and drainage more liquid and therefore easier to remove.  I recommend being 400 mg three times daily as needed.      Please follow-up within the next 3 to 5 days either with your primary care provider or urgent care if your symptoms do not resolve.  If you do not have a primary care provider, we will assist you in finding one. Thank you for visiting urgent care today.  We appreciate the opportunity to participate in your care.

## 2021-11-06 NOTE — ED Triage Notes (Signed)
Pt reports on Monday she began to have a scratchy throat, sinus congestion, fever and chills.

## 2021-11-06 NOTE — ED Provider Notes (Signed)
[facility]    CSN: [id number] Arrival date & time: 11/06/21  1151    HISTORY   Chief Complaint  Patient presents with   Nasal Congestion   Sore Throat   HPI Erin Pope is a 36 y.o. female. Patient complains of a 4-day history of scratchy throat, sinus congestion, fever and chills.  Patient is afebrile on arrival with normal vital signs otherwise.  Patient denies sick contacts.  Patient states she has been taking DayQuil and NyQuil with some relief.  Patient states her biggest concern right now is significant sinus pressure which is making her teeth hurt.  Patient reports a history of frequent sinus infections in the past usually require antibiotics at some point.  Patient states is difficult to blow her nose, never feels like she can get it all out states sometimes there is blood in the mucus and the color varies from yellow to dark brown.  Patient denies cough.  The history is provided by the patient.  Past Medical History:  Diagnosis Date   Murmur    resolved   There are no problems to display for this patient.  Past Surgical History:  Procedure Laterality Date   TYMPANOSTOMY TUBE PLACEMENT     as a child   OB History     Gravida  2   Para      Term      Preterm      AB  1   Living  1      SAB      IAB      Ectopic      Multiple      Live Births             Home Medications    Prior to Admission medications   Medication Sig Start Date End Date Taking? Authorizing Provider  Acetaminophen (TYLENOL PO) Take by mouth as needed.    [provider]  levonorgestrel (MIRENA) 20 MCG/24HR IUD 1 each by Intrauterine route once. Placed [phone]    [provider]   Family History History reviewed. No pertinent family history. Social History Social History   Tobacco Use   Smoking status: Every Day    Packs/day: 0.50    Years: 10.00    Pack years: 5.00    Types: Cigarettes   Smokeless tobacco: Never  Vaping Use   Vaping  Use: Never used  Substance Use Topics   Alcohol use: Yes    Alcohol/week: 0.0 standard drinks    Comment: social    Drug use: No   Allergies   Bactrim [sulfamethoxazole-trimethoprim]  Review of Systems Review of Systems Pertinent findings noted in history of present illness.   Physical Exam Triage Vital Signs ED Triage Vitals  Enc Vitals Group     BP 07/10/21 0827 (!) 147/82     Pulse Rate 07/10/21 0827 72     Resp 07/10/21 0827 18     Temp 07/10/21 0827 98.3 F (36.8 C)     Temp Source 07/10/21 0827 Oral     SpO2 07/10/21 0827 98 %     Weight --      Height --      Head Circumference --      Peak Flow --      Pain Score 07/10/21 0826 5     Pain Loc --      Pain Edu? --      Excl. in [place]? --   No data found.  Updated Vital Signs BP 133/81 (BP Location: Right Arm)    Pulse 71    Temp 97.6 F (36.4 C) (Oral)    Resp 20    SpO2 96%   Physical Exam Vitals and nursing note reviewed.  Constitutional:      General: She is not in acute distress.    Appearance: Normal appearance. She is not ill-appearing.  HENT:     Head: Normocephalic and atraumatic.     Salivary Glands: Right salivary gland is not diffusely enlarged or tender. Left salivary gland is not diffusely enlarged or tender.     Right Ear: Ear canal and external ear normal. No drainage. A middle ear effusion is present. There is no impacted cerumen. Tympanic membrane is bulging. Tympanic membrane is not injected or erythematous.     Left Ear: Ear canal and external ear normal. No drainage. A middle ear effusion is present. There is no impacted cerumen. Tympanic membrane is bulging. Tympanic membrane is not injected or erythematous.     Ears:     Comments: Bilateral EACs normal, both TMs bulging with clear fluid    Nose: Rhinorrhea present. No nasal deformity, septal deviation, signs of injury, nasal tenderness, mucosal edema or congestion. Rhinorrhea is clear.     Right Nostril: Occlusion present. No foreign body,  epistaxis or septal hematoma.     Left Nostril: Occlusion present. No foreign body, epistaxis or septal hematoma.     Right Turbinates: Enlarged, swollen and pale.     Left Turbinates: Enlarged, swollen and pale.     Right Sinus: Maxillary sinus tenderness and frontal sinus tenderness present.     Left Sinus: Maxillary sinus tenderness and frontal sinus tenderness present.     Mouth/Throat:     Lips: Pink. No lesions.     Mouth: Mucous membranes are moist. No oral lesions.     Pharynx: Oropharynx is clear. Uvula midline. No posterior oropharyngeal erythema or uvula swelling.     Tonsils: No tonsillar exudate. 0 on the right. 0 on the left.     Comments: Postnasal drip Eyes:     General: Lids are normal.        Right eye: No discharge.        Left eye: No discharge.     Extraocular Movements: Extraocular movements intact.     Conjunctiva/sclera: Conjunctivae normal.     Right eye: Right conjunctiva is not injected.     Left eye: Left conjunctiva is not injected.  Neck:     Trachea: Trachea and phonation normal.  Cardiovascular:     Rate and Rhythm: Normal rate and regular rhythm.     Pulses: Normal pulses.     Heart sounds: Normal heart sounds. No murmur heard.   No friction rub. No gallop.  Pulmonary:     Effort: Pulmonary effort is normal. No accessory muscle usage, prolonged expiration or respiratory distress.     Breath sounds: Normal breath sounds. No stridor, decreased air movement or transmitted upper airway sounds. No decreased breath sounds, wheezing, rhonchi or rales.  Chest:     Chest wall: No tenderness.  Musculoskeletal:        General: Normal range of motion.     Cervical back: Normal range of motion and neck supple. Normal range of motion.  Lymphadenopathy:     Cervical: No cervical adenopathy.  Skin:    General: Skin is warm and dry.     Findings: No erythema or rash.  Neurological:  General: No focal deficit present.     Mental Status: She is alert and  oriented to person, place, and time.  Psychiatric:        Mood and Affect: Mood normal.        Behavior: Behavior normal.    Visual Acuity Right Eye Distance:   Left Eye Distance:   Bilateral Distance:    Right Eye Near:   Left Eye Near:    Bilateral Near:     UC Couse / Diagnostics / Procedures:    EKG  Radiology No results found.  Procedures Procedures (including critical care time)  UC Diagnoses / Final Clinical Impressions(s)   I have reviewed the triage vital signs and the nursing notes.  Pertinent labs & imaging results that were available during my care of the patient were reviewed by me and considered in my medical decision making (see chart for details).   Final diagnoses:  Acute pansinusitis, recurrence not specified  Rhinosinusitis  Fever, unspecified fever cause  Acute upper respiratory infection   Patient likely suffering from rhinosinusitis that is a day away from converting to bacterial from viral, if not already given abnormal sinus drainage.  Will provide patient with tapering dose of steroids to calm inflammation in upper airways and back that up with azithromycin in the event she does have a bacterial infection brewing given the dark, occasionally bloody mucus she is blowing out of her nose.  ED Prescriptions     Medication Sig Dispense Auth. Provider   azithromycin (ZITHROMAX) 250 MG tablet Take 2 tablets (500 mg total) by mouth daily for 3 days. Take first 2 tablets together, then 1 every day until finished. 6 tablet Lynden Oxford Scales, PA-C   methylPREDNISolone (MEDROL DOSEPAK) 4 MG TBPK tablet Take 24 mg on day 1, 20 mg on day 2, 16 mg on day 3, 12 mg on day 4, 8 mg on day 5, 4 mg on day 6. 21 tablet Lynden Oxford Scales, PA-C   ipratropium (ATROVENT) 0.06 % nasal spray Place 2 sprays into both nostrils 4 (four) times daily. As needed for nasal congestion, runny nose 15 mL Lynden Oxford Scales, PA-C   guaifenesin (HUMIBID E) 400 MG TABS tablet  Take 1 tablet 3 times daily as needed for chest congestion and cough 21 tablet Lynden Oxford Scales, PA-C      PDMP not reviewed this encounter.  Pending results:  Labs Reviewed - No data to display  Medications Ordered in UC: Medications  ibuprofen (ADVIL) tablet 800 mg (800 mg Oral Given 11/06/21 1437)    Disposition Upon Discharge:  Condition: stable for discharge home Home: take medications as prescribed; routine discharge instructions as discussed; follow up as advised.  Patient presented with an acute illness with associated systemic symptoms and significant discomfort requiring urgent management. In my opinion, this is a condition that a prudent lay person (someone who possesses an average knowledge of health and medicine) may potentially expect to result in complications if not addressed urgently such as respiratory distress, impairment of bodily function or dysfunction of bodily organs.   Routine symptom specific, illness specific and/or disease specific instructions were discussed with the patient and/or caregiver at length.   As such, the patient has been evaluated and assessed, work-up was performed and treatment was provided in alignment with urgent care protocols and evidence based medicine.  Patient/parent/caregiver has been advised that the patient may require follow up for further testing and treatment if the symptoms continue in spite of  treatment, as clinically indicated and appropriate.  If the patient was tested for COVID-19, Influenza and/or RSV, then the patient/parent/guardian was advised to isolate at home pending the results of his/her diagnostic coronavirus test and potentially longer if theyre positive. I have also advised pt that if his/her COVID-19 test returns positive, it's recommended to self-isolate for at least 10 days after symptoms first appeared AND until fever-free for 24 hours without fever reducer AND other symptoms have improved or resolved.  Discussed self-isolation recommendations as well as instructions for household member/close contacts as per the [facility] and [place] DHHS, and also gave patient the [place] packet with this information.  Patient/parent/caregiver has been advised to return to the [facility] or PCP in 3-5 days if no better; to PCP or the Emergency Department if new signs and symptoms develop, or if the current signs or symptoms continue to change or worsen for further workup, evaluation and treatment as clinically indicated and appropriate  The patient will follow up with their current PCP if and as advised. If the patient does not currently have a PCP we will assist them in obtaining one.   The patient may need specialty follow up if the symptoms continue, in spite of conservative treatment and management, for further workup, evaluation, consultation and treatment as clinically indicated and appropriate.  Patient/parent/caregiver verbalized understanding and agreement of plan as discussed.  All questions were addressed during visit.  Please see discharge instructions below for further details of plan.  Discharge Instructions:   Discharge Instructions      Your symptoms and physical exam findings are concerning for a viral upper respiratory infection that is on the precipice of converting into a bacterial sinus infection.  Because you are having pain on your face that is radiating around your head like a vice grip, I am concerned that your sinus infection is rapidly progressing towards a bacterial infection.  The perles of treatment for sinus infection is sterilization, anti-inflammation and expectoration.  Please see the list below for recommended medications, dosages and frequencies to provide relief of your current symptoms:    Azithromycin: Please take 2 tablets once daily for the next 3 days.  You can start this today.  Methylprednisolone (Medrol Dosepak): Please take one row of tablets daily with your breakfast meal starting  tomorrow morning until the prescription is complete.   This is a steroid that will significantly calm your upper and lower airways.     Ipratropium (Atrovent): This is an excellent nasal decongestant spray that does not cause rebound congestion, can be used up to 4 times daily as needed, instill 2 sprays into each nare with each use.  I have provided you with a prescription for this medication.      Guaifenesin (Robitussin, Mucinex): This is an expectorant.  This helps break up chest congestion and loosen up thick nasal drainage making phlegm and drainage more liquid and therefore easier to remove.  I recommend being 400 mg three times daily as needed.      Please follow-up within the next 3 to 5 days either with your primary care provider or urgent care if your symptoms do not resolve.  If you do not have a primary care provider, we will assist you in finding one. Thank you for visiting urgent care today.  We appreciate the opportunity to participate in your care.     This office note has been dictated using Museum/gallery curator.  Unfortunately, and despite my best efforts, this method of  dictation can sometimes lead to occasional typographical or grammatical errors.  I apologize in advance if this occurs.     Lynden Oxford Scales, [place] 11/09/21 [phone]

## 2022-06-18 ENCOUNTER — Other Ambulatory Visit: Payer: Self-pay

## 2022-06-18 ENCOUNTER — Encounter (HOSPITAL_COMMUNITY): Payer: Self-pay

## 2022-06-18 ENCOUNTER — Emergency Department (HOSPITAL_COMMUNITY)
Admission: EM | Admit: 2022-06-18 | Discharge: 2022-06-19 | Disposition: A | Discharge status: Home / Self Care | Payer: Managed Care, Other (non HMO) | Attending: Emergency Medicine | Admitting: Emergency Medicine

## 2022-06-18 DIAGNOSIS — Y907 Blood alcohol level of 200-239 mg/100 ml: Secondary | ICD-10-CM | POA: Insufficient documentation

## 2022-06-18 DIAGNOSIS — F10920 Alcohol use, unspecified with intoxication, uncomplicated: Secondary | ICD-10-CM | POA: Diagnosis present

## 2022-06-18 DIAGNOSIS — R799 Abnormal finding of blood chemistry, unspecified: Secondary | ICD-10-CM | POA: Insufficient documentation

## 2022-06-18 LAB — COMPREHENSIVE METABOLIC PANEL
ALT: 19 U/L (ref 0–44)
AST: 21 U/L (ref 15–41)
Albumin: 3.9 g/dL (ref 3.5–5.0)
Alkaline Phosphatase: 60 U/L (ref 38–126)
Anion gap: 11 (ref 5–15)
BUN: 11 mg/dL (ref 6–20)
CO2: 23 mmol/L (ref 22–32)
Calcium: 8.9 mg/dL (ref 8.9–10.3)
Chloride: 105 mmol/L (ref 98–111)
Creatinine, Ser: 0.93 mg/dL (ref 0.44–1.00)
GFR, Estimated: >60 mL/min (ref >60)
Glucose, Bld: 108 mg/dL — ABNORMAL HIGH (ref 70–99)
Potassium: 3.3 mmol/L — ABNORMAL LOW (ref 3.5–5.1)
Sodium: 139 mmol/L (ref 135–145)
Total Bilirubin: 0.4 mg/dL (ref 0.3–1.2)
Total Protein: 7.7 g/dL (ref 6.5–8.1)

## 2022-06-18 LAB — ETHANOL: Alcohol, Ethyl (B): 201 mg/dL — ABNORMAL HIGH (ref <10)

## 2022-06-18 LAB — CBC WITH DIFFERENTIAL/PLATELET
Abs Immature Granulocytes: 0.02 10*3/uL (ref 0.00–0.07)
Basophils Absolute: 0 10*3/uL (ref 0.0–0.1)
Basophils Relative: 1 %
Eosinophils Absolute: 0.1 10*3/uL (ref 0.0–0.5)
Eosinophils Relative: 1 %
HCT: 37.3 % (ref 36.0–46.0)
Hemoglobin: 12.3 g/dL (ref 12.0–15.0)
Immature Granulocytes: 0 %
Lymphocytes Relative: 26 %
Lymphs Abs: 2.2 10*3/uL (ref 0.7–4.0)
MCH: 28.5 pg (ref 26.0–34.0)
MCHC: 33 g/dL (ref 30.0–36.0)
MCV: 86.3 fL (ref 80.0–100.0)
Monocytes Absolute: 0.5 10*3/uL (ref 0.1–1.0)
Monocytes Relative: 6 %
Neutro Abs: 5.8 10*3/uL (ref 1.7–7.7)
Neutrophils Relative %: 66 %
Platelets: 245 10*3/uL (ref 150–400)
RBC: 4.32 MIL/uL (ref 3.87–5.11)
RDW: 14.3 % (ref 11.5–15.5)
WBC: 8.6 10*3/uL (ref 4.0–10.5)
nRBC: 0 % (ref 0.0–0.2)

## 2022-06-18 LAB — CBG MONITORING, ED: Glucose-Capillary: 111 mg/dL — ABNORMAL HIGH (ref 70–99)

## 2022-06-18 LAB — I-STAT BETA HCG BLOOD, ED (MC, WL, AP ONLY): I-stat hCG, quantitative: <5 m[IU]/mL (ref <5)

## 2022-06-18 MED ORDER — SODIUM CHLORIDE 0.9 % IV BOLUS
1000.0000 mL | Freq: Once | INTRAVENOUS | Status: AC
  Administered 2022-06-18: 1000 mL via INTRAVENOUS

## 2022-06-18 MED ORDER — ONDANSETRON HCL 4 MG/2ML IJ SOLN
4.0000 mg | Freq: Once | INTRAMUSCULAR | Status: AC
  Administered 2022-06-18: 4 mg via INTRAVENOUS
  Filled 2022-06-18: qty 2

## 2022-06-18 NOTE — ED Triage Notes (Signed)
BIB [facility] with c/[place]. A/O x 4 on arrival. Admits to smoking a blunt.

## 2022-06-18 NOTE — ED Provider Notes (Signed)
[facility]-EMERGENCY DEPT Provider Note   CSN: [id number] Arrival date & time: 06/18/22  2241     History  Chief Complaint  Patient presents with   Alcohol Intoxication    Erin Pope is a 36 y.o. female.  Patient is a 35 year old female who presents with alcohol intoxication.  She was at a concert at the [employer].  She says she thinks she drank too much alcohol.  She may have smoked some marijuana.  She denies any injuries.         Home Medications Prior to Admission medications   Medication Sig Start Date End Date Taking? Authorizing Provider  Acetaminophen (TYLENOL PO) Take by mouth as needed.    [provider]  guaifenesin (HUMIBID E) 400 MG TABS tablet Take 1 tablet 3 times daily as needed for chest congestion and cough 11/06/21   Theadora Rama Scales, PA-C  ipratropium (ATROVENT) 0.06 % nasal spray Place 2 sprays into both nostrils 4 (four) times daily. As needed for nasal congestion, runny nose 11/06/21   Theadora Rama Scales, PA-C  levonorgestrel (MIRENA) 20 MCG/24HR IUD 1 each by Intrauterine route once. Placed [phone]    [provider]  methylPREDNISolone (MEDROL DOSEPAK) 4 MG TBPK tablet Take 24 mg on day 1, 20 mg on day 2, 16 mg on day 3, 12 mg on day 4, 8 mg on day 5, 4 mg on day 6. 11/06/21   Theadora Rama Scales, PA-C      Allergies    Bactrim [sulfamethoxazole-trimethoprim]    Review of Systems   Review of Systems  Constitutional:  Negative for chills, diaphoresis, fatigue and fever.  HENT:  Negative for congestion, rhinorrhea and sneezing.   Eyes: Negative.   Respiratory:  Negative for cough, chest tightness and shortness of breath.   Cardiovascular:  Negative for chest pain and leg swelling.  Gastrointestinal:  Positive for nausea and vomiting. Negative for abdominal pain, blood in stool and diarrhea.  Genitourinary:  Negative for difficulty urinating, flank pain, frequency and hematuria.   Musculoskeletal:  Negative for arthralgias and back pain.  Skin:  Negative for rash.  Neurological:  Negative for dizziness, speech difficulty, weakness, numbness and headaches.    Physical Exam Updated Vital Signs BP 136/83 (BP Location: Right Arm)   Pulse 96   Temp 98.1 F (36.7 C) (Oral)   Resp 18   SpO2 97%  Physical Exam Constitutional:      Appearance: She is well-developed.     Comments: Smells of EtOH, emesis is noted on her shirt  HENT:     Head: Normocephalic and atraumatic.  Eyes:     Pupils: Pupils are equal, round, and reactive to light.  Cardiovascular:     Rate and Rhythm: Normal rate and regular rhythm.     Heart sounds: Normal heart sounds.  Pulmonary:     Effort: Pulmonary effort is normal. No respiratory distress.     Breath sounds: Normal breath sounds. No wheezing or rales.  Chest:     Chest wall: No tenderness.  Abdominal:     General: Bowel sounds are normal.     Palpations: Abdomen is soft.     Tenderness: There is no abdominal tenderness. There is no guarding or rebound.  Musculoskeletal:        General: Normal range of motion.     Cervical back: Normal range of motion and neck supple.  Lymphadenopathy:     Cervical: No cervical adenopathy.  Skin:  General: Skin is warm and dry.     Findings: No rash.  Neurological:     Mental Status: She is alert and oriented to person, place, and time.     ED Results / Procedures / Treatments   Labs (all labs ordered are listed, but only abnormal results are displayed) Labs Reviewed  CBG MONITORING, ED - Abnormal; Notable for the following components:      Result Value   Glucose-Capillary 111 (*)    All other components within normal limits  COMPREHENSIVE METABOLIC PANEL  CBC WITH DIFFERENTIAL/PLATELET  ETHANOL  I-STAT BETA HCG BLOOD, ED ([facility], [facility], [facility] ONLY)    EKG None  Radiology No results found.  Procedures Procedures    Medications Ordered in ED Medications  sodium chloride 0.9 %  bolus 1,000 mL (has no administration in time range)  ondansetron (ZOFRAN) injection 4 mg (has no administration in time range)    ED Course/ Medical Decision Making/ A&P                           Medical Decision Making Amount and/or Complexity of Data Reviewed Labs: ordered.  Risk Prescription drug management.   Patient is a 36 year old female who presents with alcohol intoxication from a concert.  She does not have any apparent injuries.  Lab work is pending.  Was started on IV fluids and Zofran.  Will need further monitoring until she is clinically sober.  Care turned over to oncoming physician.  Final Clinical Impression(s) / ED Diagnoses Final diagnoses:  Alcoholic intoxication without complication [facility])    Rx / [place] Orders ED Discharge Orders     None         Malvin Johns, MD 06/18/22 2304

## 2022-06-18 NOTE — ED Provider Notes (Signed)
Care assumed at 2330.  Pt intoxicated and vomiting.  Plan to observe and re-eval.    On evaluation patient fianc is at the bedside.  Patient reports drinking alcohol as well as smoking part of a blunt.  No evidence of head trauma on evaluation.  No respiratory depression.  She is able to ambulate at a steady gait.  Plan to discharge in the care of her fianc with return precautions.  Feel patient is stable for discharge at this point.    Quintella Reichert, MD 06/19/22 [phone]

## 2022-06-19 NOTE — ED Notes (Signed)
Pt ambulating around room with steady gate.

## 2022-06-21 ENCOUNTER — Ambulatory Visit (INDEPENDENT_AMBULATORY_CARE_PROVIDER_SITE_OTHER): Payer: Managed Care, Other (non HMO) | Admitting: Family Medicine

## 2022-06-21 ENCOUNTER — Encounter: Payer: Self-pay | Admitting: Family Medicine

## 2022-06-21 VITALS — BP 110/72 | HR 72 | Temp 97.9°F | Resp 20 | Ht 67.0 in | Wt 278.0 lb

## 2022-06-21 DIAGNOSIS — F69 Unspecified disorder of adult personality and behavior: Secondary | ICD-10-CM | POA: Diagnosis not present

## 2022-06-21 DIAGNOSIS — G479 Sleep disorder, unspecified: Secondary | ICD-10-CM

## 2022-06-21 MED ORDER — TRAZODONE HCL 50 MG PO TABS: 50.0000 mg | ORAL_TABLET | Freq: Every evening | ORAL | 2 refills | Status: AC | PRN

## 2022-06-21 NOTE — Progress Notes (Signed)
New Patient Office Visit  Assessment & Plan:  1. Difficulty sleeping New onset.  Trazodone given to take as needed. - traZODone (DESYREL) 50 MG tablet; Take 1 tablet (50 mg total) by mouth at bedtime as needed for sleep.  Dispense: 15 tablet; Refill: 2  2. Behavior concern in adult Patient declined lab work, drug screen, and a referral for counseling.  I encouraged her to talk with her fianc at bare minimum to try to help her deal with her choices.  Note provided to excuse her from work this weekend.   Follow-up: Return in about 6 months (around 12/21/2022) for annual physical with Erin Pope.   Hendricks Limes, MSN, APRN, FNP-C [facility]  Subjective:  Patient ID: Erin Pope, female    DOB: 28-Aug-1986  Age: 36 y.o. MRN: [id number]  Patient Care Team: Erin Brooklyn, FNP as PCP - General (Family Medicine)  CC:  Chief Complaint  Patient presents with   Establish Care   Depression    And anxiety     HPI Erin Pope presents to establish care.  Her last PCP was at [place], where the office is closed.  Patient is here as she has had a hard time dealing with her recent choices.  This is causing her to be unable to sleep.  Patient states she was at a concert on Friday night where she was drinking, when she smoked some marijuana.  She only remembers bits and pieces from the night, but did end up in the ER.  States this is not her normal behavior and she acknowledges that she made some poor choices.     06/21/2022    9:57 AM  GAD 7 : Generalized Anxiety Score  Nervous, Anxious, on Edge 1  Control/stop worrying 0  Worry too much - different things 0  Trouble relaxing 1  Restless 0  Easily annoyed or irritable 0  Afraid - awful might happen 0  Total GAD 7 Score 2  Anxiety Difficulty Somewhat difficult      06/21/2022    9:54 AM 10/01/2020   12:58 PM 07/28/2018   11:04 AM  Depression screen PHQ 2/9  Decreased Interest 0 0 0  Down, Depressed, Hopeless 1 0 0   PHQ - 2 Score 1 0 0  Altered sleeping 2    Tired, decreased energy 2    Change in appetite 0    Feeling bad or failure about yourself  3    Trouble concentrating 1    Moving slowly or fidgety/restless 1    Suicidal thoughts 0    PHQ-9 Score 10    Difficult doing work/chores Very difficult       Review of Systems  Constitutional:  Negative for chills, fever, malaise/fatigue and weight loss.  HENT:  Negative for congestion, ear discharge, ear pain, nosebleeds, sinus pain, sore throat and tinnitus.   Eyes:  Negative for blurred vision, double vision, pain, discharge and redness.  Respiratory:  Negative for cough, shortness of breath and wheezing.   Cardiovascular:  Negative for chest pain, palpitations and leg swelling.  Gastrointestinal:  Negative for abdominal pain, constipation, diarrhea, heartburn, nausea and vomiting.  Genitourinary:  Negative for dysuria, frequency and urgency.  Musculoskeletal:  Positive for joint pain. Negative for myalgias.  Skin:  Negative for rash.  Neurological:  Negative for dizziness, seizures, weakness and headaches.  Psychiatric/Behavioral:  Positive for depression. Negative for substance abuse and suicidal ideas. The patient has insomnia. The patient is not nervous/anxious.  Current Outpatient Medications:    levonorgestrel (MIRENA) 20 MCG/24HR IUD, 1 each by Intrauterine route once. Placed 05-2013, Disp: , Rfl:   Allergies  Allergen Reactions   Tape     Local reaction    Bactrim [Sulfamethoxazole-Trimethoprim] Hives    Past Medical History:  Diagnosis Date   Murmur    resolved    Past Surgical History:  Procedure Laterality Date   TYMPANOSTOMY TUBE PLACEMENT     as a child    History reviewed. No pertinent family history.  Social History   Socioeconomic History   Marital status: Significant Other    Spouse name: Not on file   Number of children: 0   Years of education: Not on file   Highest education level: Not on file   Occupational History   Not on file  Tobacco Use   Smoking status: Every Day    Packs/day: 0.50    Years: 10.00    Total pack years: 5.00    Types: Cigarettes   Smokeless tobacco: Never  Vaping Use   Vaping Use: Never used  Substance and Sexual Activity   Alcohol use: Yes    Alcohol/week: 0.0 standard drinks of alcohol    Comment: social    Drug use: No   Sexual activity: Yes    Birth control/protection: I.U.D.  Other Topics Concern   Not on file  Social History Narrative   Not on file   Social Determinants of Health   Financial Resource Strain: Not on file  Food Insecurity: Not on file  Transportation Needs: Not on file  Physical Activity: Not on file  Stress: Not on file  Social Connections: Not on file  Intimate Partner Violence: Not on file    Objective:   Today's Vitals: BP 110/72   Pulse 72   Temp 97.9 F (36.6 C)   Resp 20   Ht 5\' 7"  (1.702 m)   Wt 278 lb (126.1 kg)   SpO2 97%   BMI 43.54 kg/m   Physical Exam Vitals reviewed.  Constitutional:      General: She is not in acute distress.    Appearance: Normal appearance. She is morbidly obese. She is not ill-appearing, toxic-appearing or diaphoretic.  HENT:     Head: Normocephalic and atraumatic.  Eyes:     General: No scleral icterus.       Right eye: No discharge.        Left eye: No discharge.     Conjunctiva/sclera: Conjunctivae normal.  Cardiovascular:     Rate and Rhythm: Normal rate and regular rhythm.     Heart sounds: Normal heart sounds. No murmur heard.    No friction rub. No gallop.  Pulmonary:     Effort: Pulmonary effort is normal. No respiratory distress.     Breath sounds: Normal breath sounds. No stridor. No wheezing, rhonchi or rales.  Musculoskeletal:        General: Normal range of motion.     Cervical back: Normal range of motion.  Skin:    General: Skin is warm and dry.     Capillary Refill: Capillary refill takes less than 2 seconds.  Neurological:     General: No  focal deficit present.     Mental Status: She is alert and oriented to person, place, and time. Mental status is at baseline.  Psychiatric:        Mood and Affect: Mood normal.        Behavior: Behavior normal.  Thought Content: Thought content normal.        Judgment: Judgment normal.

## 2022-10-08 ENCOUNTER — Encounter: Payer: Self-pay | Admitting: *Deleted

## 2022-12-21 ENCOUNTER — Encounter: Payer: Managed Care, Other (non HMO) | Admitting: Family Medicine

## 2022-12-21 ENCOUNTER — Encounter: Payer: Managed Care, Other (non HMO) | Admitting: Nurse Practitioner

## 2023-06-07 ENCOUNTER — Ambulatory Visit
Admission: RE | Admit: 2023-06-07 | Discharge: 2023-06-07 | Disposition: A | Discharge status: Home / Self Care | Payer: Self-pay | Source: Ambulatory Visit | Attending: Internal Medicine | Admitting: Internal Medicine

## 2023-06-07 VITALS — BP 124/81 | HR 93 | Temp 98.6°F | Resp 20

## 2023-06-07 DIAGNOSIS — J209 Acute bronchitis, unspecified: Secondary | ICD-10-CM

## 2023-06-07 DIAGNOSIS — F17200 Nicotine dependence, unspecified, uncomplicated: Secondary | ICD-10-CM

## 2023-06-07 MED ORDER — CETIRIZINE HCL 10 MG PO TABS: 10.0000 mg | ORAL_TABLET | Freq: Every day | ORAL | 0 refills | Status: AC

## 2023-06-07 MED ORDER — ALBUTEROL SULFATE HFA 108 (90 BASE) MCG/ACT IN AERS: 1.0000 | INHALATION_SPRAY | Freq: Four times a day (QID) | RESPIRATORY_TRACT | 0 refills | Status: AC | PRN

## 2023-06-07 MED ORDER — PREDNISONE 20 MG PO TABS: ORAL_TABLET | ORAL | 0 refills | Status: AC

## 2023-06-07 MED ORDER — PROMETHAZINE-DM 6.25-15 MG/5ML PO SYRP: 5.0000 mL | ORAL_SOLUTION | Freq: Three times a day (TID) | ORAL | 0 refills | Status: AC | PRN

## 2023-06-07 NOTE — ED Triage Notes (Addendum)
Pt c/o cough, head/chest congestion, HA x 4 days-taking vit c and zinc-NAD-steady gait

## 2023-06-07 NOTE — Discharge Instructions (Addendum)
We will manage this as a viral bronchitis. For sore throat or cough try using a honey-based tea. Use 3 teaspoons of honey with juice squeezed from half lemon. Place shaved pieces of ginger into 1/2-1 cup of water and warm over stove top. Then mix the ingredients and repeat every 4 hours as needed. Please take Tylenol every 6 hours for aches and pains, fevers. Hydrate very well with at least 2 liters of water. Eat light meals such as soups to replenish electrolytes and soft fruits, veggies. Start an antihistamine like Zyrtec (10mg  daily) for postnasal drainage, sinus congestion.  You can take this together with prednisone and albuterol.  Use the cough medications as needed.

## 2023-06-07 NOTE — ED Provider Notes (Signed)
[facility]  Note:  This document was prepared using Conservation officer, historic buildings and may include unintentional dictation errors.  MRN: [id number] DOB: [birth year]  Subjective:   Erin Pope is a 37 y.o. female presenting for 4 day history of acute onset chest pain, productive cough, wheezing following coughing fits. Did a COVID test last week and was negative.  Does not want a repeat.  Smokes 1/2ppd. No vaping.  No current facility-administered medications for this encounter.  Current Outpatient Medications:    levonorgestrel (MIRENA) 20 MCG/24HR IUD, 1 each by Intrauterine route once. Placed 05-2013, Disp: , Rfl:    traZODone (DESYREL) 50 MG tablet, Take 1 tablet (50 mg total) by mouth at bedtime as needed for sleep., Disp: 15 tablet, Rfl: 2   Allergies  Allergen Reactions   Tape     Local reaction    Bactrim [Sulfamethoxazole-Trimethoprim] Hives    Past Medical History:  Diagnosis Date   Murmur    resolved     Past Surgical History:  Procedure Laterality Date   TYMPANOSTOMY TUBE PLACEMENT     as a child    No family history on file.  Social History   Tobacco Use   Smoking status: Every Day    Current packs/day: 0.50    Average packs/day: 0.5 packs/day for 10.0 years (5.0 ttl pk-yrs)    Types: Cigarettes   Smokeless tobacco: Never  Vaping Use   Vaping status: Never Used  Substance Use Topics   Alcohol use: Not Currently    Comment: occ   Drug use: Yes    Types: Marijuana    ROS   Objective:   Vitals: BP 124/81 (BP Location: Right Arm)   Pulse 93   Temp 98.6 F (37 C) (Oral)   Resp 20   SpO2 97%   Physical Exam Constitutional:      General: She is not in acute distress.    Appearance: Normal appearance. She is well-developed and normal weight. She is not ill-appearing, toxic-appearing or diaphoretic.  HENT:     Head: Normocephalic and atraumatic.     Right Ear: Tympanic membrane, ear canal and external ear normal.  No drainage or tenderness. No middle ear effusion. There is no impacted cerumen. Tympanic membrane is not erythematous or bulging.     Left Ear: Tympanic membrane, ear canal and external ear normal. No drainage or tenderness.  No middle ear effusion. There is no impacted cerumen. Tympanic membrane is not erythematous or bulging.     Nose: Nose normal. No congestion or rhinorrhea.     Mouth/Throat:     Mouth: Mucous membranes are moist. No oral lesions.     Pharynx: No pharyngeal swelling, oropharyngeal exudate, posterior oropharyngeal erythema or uvula swelling.     Tonsils: No tonsillar exudate or tonsillar abscesses.  Eyes:     General: No scleral icterus.       Right eye: No discharge.        Left eye: No discharge.     Extraocular Movements: Extraocular movements intact.     Right eye: Normal extraocular motion.     Left eye: Normal extraocular motion.     Conjunctiva/sclera: Conjunctivae normal.  Cardiovascular:     Rate and Rhythm: Normal rate and regular rhythm.     Heart sounds: Normal heart sounds. No murmur heard.    No friction rub. No gallop.  Pulmonary:     Effort: Pulmonary effort is normal. No respiratory distress.  Breath sounds: No stridor. No wheezing, rhonchi or rales.     Comments: Slight decrease in lung sounds throughout. Chest:     Chest wall: No tenderness.  Musculoskeletal:     Cervical back: Normal range of motion and neck supple.  Lymphadenopathy:     Cervical: No cervical adenopathy.  Skin:    General: Skin is warm and dry.  Neurological:     General: No focal deficit present.     Mental Status: She is alert and oriented to person, place, and time.  Psychiatric:        Mood and Affect: Mood normal.        Behavior: Behavior normal.     Assessment and Plan :   PDMP not reviewed this encounter.  1. Acute bronchitis, unspecified organism   2. Smoker    Recommend an oral prednisone course, albuterol and general supportive care for viral  bronchitis.  Patient declined a COVID test.  Deferred imaging given clear cardiopulmonary exam, hemodynamically stable vital signs.  Counseled patient on potential for adverse effects with medications prescribed/recommended today, ER and return-to-clinic precautions discussed, patient verbalized understanding.    Wallis Bamberg, [place] 06/07/23 1410
